# Patient Record
Sex: Female | Born: 1955 | Race: White | Hispanic: No | Marital: Married | Smoking: Never smoker
Health system: Southern US, Community
[De-identification: ages and names within clinical notes are randomized; demographics above are authoritative.]

## PROBLEM LIST (undated history)

## (undated) DIAGNOSIS — M8580 Other specified disorders of bone density and structure, unspecified site: Secondary | ICD-10-CM

## (undated) DIAGNOSIS — M1990 Unspecified osteoarthritis, unspecified site: Secondary | ICD-10-CM

## (undated) DIAGNOSIS — J349 Unspecified disorder of nose and nasal sinuses: Secondary | ICD-10-CM

## (undated) DIAGNOSIS — E213 Hyperparathyroidism, unspecified: Secondary | ICD-10-CM

## (undated) DIAGNOSIS — I519 Heart disease, unspecified: Secondary | ICD-10-CM

## (undated) DIAGNOSIS — K589 Irritable bowel syndrome without diarrhea: Secondary | ICD-10-CM

## (undated) DIAGNOSIS — E785 Hyperlipidemia, unspecified: Secondary | ICD-10-CM

## (undated) DIAGNOSIS — C439 Malignant melanoma of skin, unspecified: Secondary | ICD-10-CM

## (undated) DIAGNOSIS — R51 Headache: Secondary | ICD-10-CM

## (undated) DIAGNOSIS — Z973 Presence of spectacles and contact lenses: Secondary | ICD-10-CM

## (undated) HISTORY — DX: Malignant melanoma of skin, unspecified: C43.9

## (undated) HISTORY — DX: Unspecified disorder of nose and nasal sinuses: J34.9

## (undated) HISTORY — DX: Unspecified osteoarthritis, unspecified site: M19.90

## (undated) HISTORY — DX: Heart disease, unspecified: I51.9

## (undated) HISTORY — DX: Presence of spectacles and contact lenses: Z97.3

## (undated) HISTORY — DX: Hyperlipidemia, unspecified: E78.5

## (undated) HISTORY — PX: APPENDECTOMY: SHX54

## (undated) HISTORY — PX: ABDOMINAL HYSTERECTOMY: SHX81

## (undated) HISTORY — PX: OOPHORECTOMY: SHX86

## (undated) HISTORY — DX: Headache: R51

## (undated) HISTORY — DX: Irritable bowel syndrome, unspecified: K58.9

---

## 1983-05-10 HISTORY — PX: ABDOMINAL HYSTERECTOMY: SHX81

## 1989-05-09 HISTORY — PX: ASD REPAIR: SHX258

## 2002-05-09 DIAGNOSIS — C439 Malignant melanoma of skin, unspecified: Secondary | ICD-10-CM

## 2002-05-09 HISTORY — DX: Malignant melanoma of skin, unspecified: C43.9

## 2006-09-04 ENCOUNTER — Ambulatory Visit: Payer: Self-pay | Admitting: Unknown Physician Specialty

## 2007-04-24 ENCOUNTER — Encounter: Admission: RE | Admit: 2007-04-24 | Discharge: 2007-04-24 | Payer: Self-pay | Admitting: Surgery

## 2007-05-22 ENCOUNTER — Encounter: Admission: RE | Admit: 2007-05-22 | Discharge: 2007-05-22 | Payer: Self-pay | Admitting: Surgery

## 2008-09-04 ENCOUNTER — Ambulatory Visit: Payer: Self-pay | Admitting: Family Medicine

## 2010-01-18 ENCOUNTER — Ambulatory Visit: Payer: Self-pay | Admitting: Gastroenterology

## 2010-02-22 ENCOUNTER — Ambulatory Visit: Payer: Self-pay | Admitting: Gastroenterology

## 2010-02-25 LAB — PATHOLOGY REPORT

## 2010-05-30 ENCOUNTER — Encounter: Payer: Self-pay | Admitting: Surgery

## 2010-09-07 ENCOUNTER — Encounter (INDEPENDENT_AMBULATORY_CARE_PROVIDER_SITE_OTHER): Payer: Self-pay | Admitting: Surgery

## 2011-02-22 ENCOUNTER — Ambulatory Visit: Payer: Self-pay | Admitting: Obstetrics and Gynecology

## 2011-05-13 ENCOUNTER — Ambulatory Visit: Payer: Self-pay | Admitting: Obstetrics and Gynecology

## 2011-05-25 ENCOUNTER — Telehealth (INDEPENDENT_AMBULATORY_CARE_PROVIDER_SITE_OTHER): Payer: Self-pay

## 2011-05-25 ENCOUNTER — Other Ambulatory Visit (INDEPENDENT_AMBULATORY_CARE_PROVIDER_SITE_OTHER): Payer: Self-pay | Admitting: Surgery

## 2011-06-01 ENCOUNTER — Ambulatory Visit (INDEPENDENT_AMBULATORY_CARE_PROVIDER_SITE_OTHER): Payer: Self-pay | Admitting: Surgery

## 2011-06-23 ENCOUNTER — Ambulatory Visit: Payer: Self-pay | Admitting: Gastroenterology

## 2011-06-23 NOTE — Telephone Encounter (Signed)
error 

## 2011-08-25 ENCOUNTER — Ambulatory Visit: Payer: Self-pay | Admitting: Obstetrics and Gynecology

## 2011-09-02 ENCOUNTER — Ambulatory Visit: Payer: Self-pay | Admitting: Obstetrics and Gynecology

## 2011-09-02 HISTORY — PX: OOPHORECTOMY: SHX86

## 2011-09-06 LAB — PATHOLOGY REPORT

## 2014-08-29 ENCOUNTER — Ambulatory Visit
Admit: 2014-08-29 | Disposition: A | Discharge status: Home / Self Care | Payer: Self-pay | Attending: Internal Medicine | Admitting: Internal Medicine

## 2014-08-31 NOTE — Op Note (Signed)
PATIENT NAME:  Sheri Curtis, Sheri Curtis MR#:  [id number] DATE OF BIRTH:  05-Sep-1955  DATE OF PROCEDURE:  09/02/2011  PREOPERATIVE DIAGNOSIS: Symptomatic right ovarian cyst with chronic pelvic pain.   POSTOPERATIVE DIAGNOSES:  1. Chronic pelvic pain, right. 2. Right ovarian cyst.  3. Right hydrosalpinx. 4. Extensive pelvic adhesions.  PROCEDURES PERFORMED: 1. Extensive pelvic adhesiolysis encompassing greater than 75% of total operating time.  2. Right salpingo-oophorectomy.   SURGEON: Boykin Nearing, MD   FIRST ASSISTANT: Franchot Erichsen, MD  ANESTHESIA: General endotracheal anesthesia.   INDICATIONS: This is a 59 year old gravida 2, para 2 a patient with intermittent right lower quadrant, right pelvic pain for greater than one year. The patient has had a persistent ovarian cyst on the right measuring 37 x 30 x 12 mm. The patient is status post total abdominal hysterectomy.   DESCRIPTION OF PROCEDURE: After adequate general endotracheal anesthesia, the patient was placed in the dorsal supine position with legs in the [place] stirrups. The patient's abdomen was prepped and draped in normal sterile fashion including perineum and vagina. Straight catheterization of the bladder yielded 50 mL of urine. A 15 mm infraumbilical incision was made, after injecting with 0.5% Marcaine. The laparoscope was advanced into the abdominal cavity under direct visualization with the Optiview cannula. Once inside the abdominal cavity, the patient's abdomen was insufflated with carbon dioxide. Two separate additional ports were placed, one in the left lower quadrant 3 cm medial to the anterior iliac spine, and under direct vision visualization, a 10 mm port was advanced into the abdominal cavity. The 5 mm port was advanced on the right lower quadrant, again 3 cm medial to the anterior iliac spine, and the 5 mm port was advanced under direct visualization. The patient was placed in Trendelenburg. On initial impression,  there were extensive pelvic adhesions from the left sidewall all the way to the right sidewall incorporating bowel to bowel and bowel to cuff and bowel to sidewall. Extensive adhesiolysis then ensued with the Harmonic scalpel. Total adhesiolysis was greater than 75% of total operating time. Attention was directed starting on the left side where the sigmoid adhesions were removed from the sidewall. Small bowel adhesions to the sigmoid were removed as well as small bowel adhesions to the vaginal cuff were taken down meticulously. Intraoperative pictures were taken. Ultimately the right sidewall was freed up so the dilated fallopian tube consistent with a hydrosalpinx was identified. There was a fluid-filled cyst juxtaposed to the ovary, ovarian tissue all encompassed and scarred together. The infundibulopelvic ligament was identified and cauterized and then was transected with the Harmonic scalpel. The right ovarian tubal complex was placed on tension medially while dissection ensued to the lateral aspect. The sidewall was not entered and the retroperitoneum was not entered. Once the total mass was free, the ovarian cyst was inadvertently ruptured and straw-colored fluid drained from this cyst. The total mass then was placed into an Endopouch and was removed through the left port site; good hemostasis was noted on the left. Continued adhesiolysis was performed opening up the posterior cul-de-sac. Given the dense adhesions on the patient's left side, the left ovary and tube were never identified. At this point, the surgeons felt that it would be too difficult to try to continue adhesiolysis to get to the left tube and ovary; therefore, the procedure was aborted. The risk to bowel injury was greater than the benefit of removing the left tube and ovary given the patient was not having any left-sided symptoms. The  patient's abdomen was copiously irrigated. The intra-abdominal pressure was taken down, good hemostasis  noted. All port sites were removed. The infraumbilical and the left lower port site were closed in a deep layer with 2-0 Vicryl and all skin port sites were closed with interrupted 4-0 Vicryl suture. Steri-Strips were applied, sterile dressing applied. The sponge stick was removed and the Foley catheter which was placed intraoperatively was removed. The Foley was placed midway through the procedure since there appeared to be some bladder filling and distention while adhesiolysis was taking place. Urine was clear when the Foley was removed. Estimated blood loss minimal. Intraoperative fluids 600 mL. Urine output 125 mL. The patient tolerated the procedure well and was taken to the recovery room in good condition. ____________________________ Boykin Nearing, MD tjs:slb D: 09/02/2011 09:21:47 ET T: 09/02/2011 11:53:58 ET JOB#: [id number]  cc: Boykin Nearing, MD, <Dictator> Boykin Nearing MD ELECTRONICALLY SIGNED 09/06/2011 9:47

## 2014-09-02 ENCOUNTER — Other Ambulatory Visit: Payer: Self-pay | Admitting: Internal Medicine

## 2014-09-02 DIAGNOSIS — Z1231 Encounter for screening mammogram for malignant neoplasm of breast: Secondary | ICD-10-CM

## 2014-09-09 ENCOUNTER — Ambulatory Visit
Admission: RE | Admit: 2014-09-09 | Discharge: 2014-09-09 | Disposition: A | Discharge status: Home / Self Care | Payer: 59 | Source: Ambulatory Visit | Attending: Internal Medicine | Admitting: Internal Medicine

## 2014-09-09 DIAGNOSIS — Z1231 Encounter for screening mammogram for malignant neoplasm of breast: Secondary | ICD-10-CM

## 2017-07-17 ENCOUNTER — Other Ambulatory Visit: Payer: Self-pay | Admitting: Internal Medicine

## 2017-07-17 DIAGNOSIS — Z1231 Encounter for screening mammogram for malignant neoplasm of breast: Secondary | ICD-10-CM

## 2017-07-31 ENCOUNTER — Ambulatory Visit
Admission: RE | Admit: 2017-07-31 | Discharge: 2017-07-31 | Disposition: A | Discharge status: Home / Self Care | Payer: BC Managed Care – PPO | Source: Ambulatory Visit | Attending: Internal Medicine | Admitting: Internal Medicine

## 2017-07-31 ENCOUNTER — Encounter: Payer: Self-pay | Admitting: Radiology

## 2017-07-31 DIAGNOSIS — Z1231 Encounter for screening mammogram for malignant neoplasm of breast: Secondary | ICD-10-CM | POA: Diagnosis present

## 2018-04-02 HISTORY — PX: COLONOSCOPY WITH ESOPHAGOGASTRODUODENOSCOPY (EGD): SHX5779

## 2018-06-05 ENCOUNTER — Other Ambulatory Visit: Payer: Self-pay | Admitting: Gastroenterology

## 2018-06-05 ENCOUNTER — Other Ambulatory Visit (HOSPITAL_COMMUNITY): Payer: Self-pay | Admitting: Gastroenterology

## 2018-06-05 DIAGNOSIS — R1013 Epigastric pain: Secondary | ICD-10-CM

## 2018-06-05 DIAGNOSIS — R112 Nausea with vomiting, unspecified: Secondary | ICD-10-CM

## 2019-04-09 HISTORY — PX: OTHER SURGICAL HISTORY: SHX169

## 2019-08-02 ENCOUNTER — Ambulatory Visit: Payer: BC Managed Care – PPO | Attending: Internal Medicine

## 2019-08-02 DIAGNOSIS — Z23 Encounter for immunization: Secondary | ICD-10-CM

## 2019-08-02 NOTE — Progress Notes (Signed)
   Covid-19 Vaccination Clinic  Name:  Sheri Curtis    MRN: [id number] DOB: 1955/09/27  08/02/2019  [name] was observed post Covid-19 immunization for 15 minutes without incident. She was provided with Vaccine Information Sheet and instruction to access the V-Safe system.   [name] was instructed to call 911 with any severe reactions post vaccine: Marland Kitchen Difficulty breathing  . Swelling of face and throat  . A fast heartbeat  . A bad rash all over body  . Dizziness and weakness   Immunizations Administered    Name Date Dose VIS Date Route   Pfizer COVID-19 Vaccine 08/02/2019 10:27 AM 0.3 mL 04/19/2019 Intramuscular   Manufacturer: [place]   Lot: [id number]   [place]: [id number]

## 2019-08-27 ENCOUNTER — Ambulatory Visit: Payer: BC Managed Care – PPO | Attending: Internal Medicine

## 2019-08-27 DIAGNOSIS — Z23 Encounter for immunization: Secondary | ICD-10-CM

## 2019-08-27 NOTE — Progress Notes (Signed)
   Covid-19 Vaccination Clinic  Name:  CARLIN MADERO    MRN: [id number] DOB: [birth year]  08/27/2019  Ms. Groven was observed post Covid-19 immunization for 15 minutes without incident. She was provided with Vaccine Information Sheet and instruction to access the V-Safe system.   Ms. Forbus was instructed to call 911 with any severe reactions post vaccine: Marland Kitchen Difficulty breathing  . Swelling of face and throat  . A fast heartbeat  . A bad rash all over body  . Dizziness and weakness   Immunizations Administered    Name Date Dose VIS Date Route   Pfizer COVID-19 Vaccine 08/27/2019  1:57 PM 0.3 mL 07/03/2018 Intramuscular   Manufacturer: [place]   Lot: [id number]   [place]: [id number]

## 2020-03-09 ENCOUNTER — Other Ambulatory Visit: Payer: Self-pay | Admitting: Internal Medicine

## 2020-03-09 DIAGNOSIS — Z1231 Encounter for screening mammogram for malignant neoplasm of breast: Secondary | ICD-10-CM

## 2020-04-21 ENCOUNTER — Ambulatory Visit
Admission: RE | Admit: 2020-04-21 | Discharge: 2020-04-21 | Disposition: A | Discharge status: Home / Self Care | Payer: BC Managed Care – PPO | Source: Ambulatory Visit | Attending: Internal Medicine | Admitting: Internal Medicine

## 2020-04-21 ENCOUNTER — Other Ambulatory Visit: Payer: Self-pay

## 2020-04-21 DIAGNOSIS — Z1231 Encounter for screening mammogram for malignant neoplasm of breast: Secondary | ICD-10-CM | POA: Diagnosis present

## 2021-01-04 ENCOUNTER — Other Ambulatory Visit: Payer: Self-pay | Admitting: Student

## 2021-01-04 DIAGNOSIS — S83241A Other tear of medial meniscus, current injury, right knee, initial encounter: Secondary | ICD-10-CM

## 2021-01-28 ENCOUNTER — Ambulatory Visit
Admission: RE | Admit: 2021-01-28 | Discharge: 2021-01-28 | Disposition: A | Discharge status: Home / Self Care | Payer: Medicare PPO | Source: Ambulatory Visit | Attending: Student | Admitting: Student

## 2021-01-28 ENCOUNTER — Ambulatory Visit: Admission: RE | Admit: 2021-01-28 | Payer: Medicare PPO | Source: Ambulatory Visit

## 2021-01-28 ENCOUNTER — Other Ambulatory Visit: Payer: Self-pay

## 2021-01-28 DIAGNOSIS — S83241A Other tear of medial meniscus, current injury, right knee, initial encounter: Secondary | ICD-10-CM | POA: Diagnosis present

## 2021-01-28 DIAGNOSIS — X58XXXA Exposure to other specified factors, initial encounter: Secondary | ICD-10-CM | POA: Diagnosis not present

## 2021-01-28 DIAGNOSIS — M1711 Unilateral primary osteoarthritis, right knee: Secondary | ICD-10-CM | POA: Diagnosis not present

## 2021-01-28 HISTORY — PX: IR FLUORO GUIDED NEEDLE PLC ASPIRATION/INJECTION LOC: IMG2395

## 2021-01-28 MED ORDER — SODIUM CHLORIDE (PF) 0.9 % IJ SOLN
INTRAMUSCULAR | Status: AC
  Administered 2021-01-28: 10 mL
  Filled 2021-01-28: qty 10

## 2021-01-28 MED ORDER — LIDOCAINE HCL 1 % IJ SOLN
INTRAMUSCULAR | Status: AC
  Administered 2021-01-28: 10 mL
  Filled 2021-01-28: qty 20

## 2021-01-28 MED ORDER — IOHEXOL 180 MG/ML  SOLN
30.0000 mL | Freq: Once | INTRAMUSCULAR | Status: AC | PRN
  Administered 2021-01-28: 30 mL via INTRA_ARTICULAR
  Filled 2021-01-28: qty 30

## 2021-01-28 NOTE — Procedures (Signed)
Successful fluoroscopic guided right knee CT arthrogram. No complications. EBL N/A See PACS for full report.  Hedy Jacob, PA-C 01/28/2021, 10:31 AM

## 2021-03-16 ENCOUNTER — Other Ambulatory Visit: Payer: Self-pay | Admitting: Internal Medicine

## 2021-03-16 DIAGNOSIS — Z1231 Encounter for screening mammogram for malignant neoplasm of breast: Secondary | ICD-10-CM

## 2021-04-28 ENCOUNTER — Other Ambulatory Visit: Payer: Self-pay

## 2021-04-28 ENCOUNTER — Ambulatory Visit
Admission: RE | Admit: 2021-04-28 | Discharge: 2021-04-28 | Disposition: A | Discharge status: Home / Self Care | Payer: Medicare PPO | Source: Ambulatory Visit | Attending: Internal Medicine | Admitting: Internal Medicine

## 2021-04-28 DIAGNOSIS — Z1231 Encounter for screening mammogram for malignant neoplasm of breast: Secondary | ICD-10-CM | POA: Diagnosis not present

## 2021-05-04 ENCOUNTER — Other Ambulatory Visit: Payer: Self-pay | Admitting: Surgery

## 2021-05-12 ENCOUNTER — Other Ambulatory Visit
Admission: RE | Admit: 2021-05-12 | Discharge: 2021-05-12 | Disposition: A | Discharge status: Home / Self Care | Payer: Medicare PPO | Source: Ambulatory Visit | Attending: Surgery | Admitting: Surgery

## 2021-05-12 ENCOUNTER — Other Ambulatory Visit: Payer: Self-pay

## 2021-05-12 NOTE — Patient Instructions (Signed)
Your procedure is scheduled on: Tuesday May 18, 2021 Report to Day Surgery. To find out your arrival time please call [phone] between 1PM - 3PM on Monday May 17, 2021.  Remember: Instructions that are not followed completely may result in serious medical risk,  up to and including death, or upon the discretion of your surgeon and anesthesiologist your  surgery may need to be rescheduled.     _X__ 1. Do not eat food after midnight the night before your procedure.                 No chewing gum or hard candies. You may drink clear liquids up to 2 hours                 before you are scheduled to arrive for your surgery- DO not drink clear                 liquids within 2 hours of the start of your surgery.                 Clear Liquids include:  water, apple juice without pulp, clear Gatorade, G2 or                  Gatorade Zero (avoid Red/Purple/Blue), Black Coffee or Tea (Do not add                 anything to coffee or tea). __X__2.   Complete the "Ensure Clear Pre-surgery Clear Carbohydrate Drink" provided to you, 2 hours before arrival. **If you are diabetic you will be provided with an alternative drink, Gatorade Zero or G2.  __X__3.  On the morning of surgery brush your teeth with toothpaste and water, you                may rinse your mouth with mouthwash if you wish.  Do not swallow any toothpaste of mouthwash.     _X__ 4.  No Alcohol for 24 hours before or after surgery.   _X__ 5.  Do Not Smoke or use e-cigarettes For 24 Hours Prior to Your Surgery.                 Do not use any chewable tobacco products for at least 6 hours prior to                 Surgery.  _X__  6.  Do not use any recreational drugs (marijuana, cocaine, heroin, ecstasy, MDMA or other)                For at least one week prior to your surgery.  Combination of these drugs with anesthesia                May have life threatening results.  __X__  7.  Notify your doctor if  there is any change in your medical condition      (cold, fever, infections).     Do not wear jewelry, make-up, hairpins, clips or nail polish. Do not wear lotions, powders, or perfumes. You may wear deodorant. Do not shave 48 hours prior to surgery. Men may shave face and neck. Do not bring valuables to the hospital.    [facility] is not responsible for any belongings or valuables.  Contacts, dentures or bridgework may not be worn into surgery. Leave your suitcase in the car. After surgery it may be brought to your room. For patients admitted to the hospital, discharge  time is determined by your treatment team.   Patients discharged the day of surgery will not be allowed to drive home.   Make arrangements for someone to be with you for the first 24 hours of your Same Day Discharge.   _X___ Take these medicines the morning of surgery with A SIP OF WATER:    1. colestipol (COLESTID) 1 g   2. dicyclomine (BENTYL) 10 MG   3.   4.  5.  6.  ____ Fleet Enema (as directed)   __X__ Use CHG Soap (or wipes) as directed  ____ Use Benzoyl Peroxide Gel as instructed  ____ Use inhalers on the day of surgery  ____ Stop metformin 2 days prior to surgery    ____ Take 1/2 of usual insulin dose the night before surgery. No insulin the morning          of surgery.   ____ Call your PCP, cardiologist, or Pulmonologist if taking Coumadin/Plavix/aspirin and ask when to stop before your surgery.   __X__ One Week prior to surgery- Stop Anti-inflammatories such as Ibuprofen, Aleve, Advil, Motrin, meloxicam (MOBIC), diclofenac, etodolac, ketorolac, Toradol, Daypro, piroxicam, Goody's or BC powders. OK TO USE TYLENOL IF NEEDED   __X__ One week prior to surgery sop ALL supplements until after surgery.    ____ Bring C-Pap to the hospital.    If you have any questions regarding your pre-procedure instructions,  Please call Pre-admit Testing at [phone]

## 2021-05-18 ENCOUNTER — Ambulatory Visit: Admit: 2021-05-18 | Payer: Medicare PPO | Admitting: Surgery

## 2021-05-18 SURGERY — ARTHROSCOPY, KNEE
Anesthesia: Choice | Site: Knee | Laterality: Right

## 2021-06-02 ENCOUNTER — Encounter
Admission: RE | Admit: 2021-06-02 | Discharge: 2021-06-02 | Disposition: A | Discharge status: Home / Self Care | Payer: Medicare PPO | Source: Ambulatory Visit | Attending: Surgery | Admitting: Surgery

## 2021-06-02 ENCOUNTER — Other Ambulatory Visit: Payer: Self-pay | Admitting: Surgery

## 2021-06-02 ENCOUNTER — Encounter: Payer: Self-pay | Admitting: Urgent Care

## 2021-06-02 ENCOUNTER — Other Ambulatory Visit: Payer: Self-pay

## 2021-06-02 DIAGNOSIS — Z01818 Encounter for other preprocedural examination: Secondary | ICD-10-CM | POA: Diagnosis present

## 2021-06-02 DIAGNOSIS — I519 Heart disease, unspecified: Secondary | ICD-10-CM | POA: Diagnosis not present

## 2021-06-02 DIAGNOSIS — Z01810 Encounter for preprocedural cardiovascular examination: Secondary | ICD-10-CM | POA: Diagnosis not present

## 2021-06-02 DIAGNOSIS — Z01812 Encounter for preprocedural laboratory examination: Secondary | ICD-10-CM

## 2021-06-02 LAB — CBC
HCT: 43.6 % (ref 36.0–46.0)
Hemoglobin: 15 g/dL (ref 12.0–15.0)
MCH: 32.1 pg (ref 26.0–34.0)
MCHC: 34.4 g/dL (ref 30.0–36.0)
MCV: 93.2 fL (ref 80.0–100.0)
Platelets: 260 10*3/uL (ref 150–400)
RBC: 4.68 MIL/uL (ref 3.87–5.11)
RDW: 12 % (ref 11.5–15.5)
WBC: 5.6 10*3/uL (ref 4.0–10.5)
nRBC: 0 % (ref 0.0–0.2)

## 2021-06-02 LAB — BASIC METABOLIC PANEL
Anion gap: 7 (ref 5–15)
BUN: 17 mg/dL (ref 8–23)
CO2: 27 mmol/L (ref 22–32)
Calcium: 10.3 mg/dL (ref 8.9–10.3)
Chloride: 106 mmol/L (ref 98–111)
Creatinine, Ser: 0.67 mg/dL (ref 0.44–1.00)
GFR, Estimated: >60 mL/min (ref >60)
Glucose, Bld: 94 mg/dL (ref 70–99)
Potassium: 4.2 mmol/L (ref 3.5–5.1)
Sodium: 140 mmol/L (ref 135–145)

## 2021-06-03 ENCOUNTER — Ambulatory Visit
Admission: RE | Admit: 2021-06-03 | Discharge: 2021-06-03 | Disposition: A | Discharge status: Home / Self Care | Payer: Medicare PPO | Attending: Surgery | Admitting: Surgery

## 2021-06-03 ENCOUNTER — Ambulatory Visit: Payer: Medicare PPO | Admitting: Anesthesiology

## 2021-06-03 ENCOUNTER — Encounter
Admission: RE | Disposition: A | Discharge status: Home / Self Care | Payer: Self-pay | Source: Home / Self Care | Attending: Surgery

## 2021-06-03 ENCOUNTER — Other Ambulatory Visit: Payer: Self-pay

## 2021-06-03 ENCOUNTER — Encounter: Payer: Self-pay | Admitting: Surgery

## 2021-06-03 DIAGNOSIS — Y939 Activity, unspecified: Secondary | ICD-10-CM | POA: Insufficient documentation

## 2021-06-03 DIAGNOSIS — S83271A Complex tear of lateral meniscus, current injury, right knee, initial encounter: Secondary | ICD-10-CM | POA: Diagnosis not present

## 2021-06-03 DIAGNOSIS — M94261 Chondromalacia, right knee: Secondary | ICD-10-CM | POA: Insufficient documentation

## 2021-06-03 DIAGNOSIS — X58XXXA Exposure to other specified factors, initial encounter: Secondary | ICD-10-CM | POA: Diagnosis not present

## 2021-06-03 DIAGNOSIS — S83231A Complex tear of medial meniscus, current injury, right knee, initial encounter: Secondary | ICD-10-CM | POA: Diagnosis not present

## 2021-06-03 DIAGNOSIS — Z01812 Encounter for preprocedural laboratory examination: Secondary | ICD-10-CM

## 2021-06-03 DIAGNOSIS — M1711 Unilateral primary osteoarthritis, right knee: Secondary | ICD-10-CM | POA: Diagnosis present

## 2021-06-03 HISTORY — PX: KNEE ARTHROSCOPY: SHX127

## 2021-06-03 SURGERY — ARTHROSCOPY, KNEE
Anesthesia: General | Site: Knee | Laterality: Right

## 2021-06-03 MED ORDER — CEFAZOLIN SODIUM-DEXTROSE 2-4 GM/100ML-% IV SOLN
INTRAVENOUS | Status: AC
  Filled 2021-06-03: qty 100

## 2021-06-03 MED ORDER — MEPERIDINE HCL 25 MG/ML IJ SOLN
INTRAMUSCULAR | Status: AC
  Filled 2021-06-03: qty 1

## 2021-06-03 MED ORDER — PROPOFOL 10 MG/ML IV BOLUS
INTRAVENOUS | Status: DC | PRN
Start: 2021-06-03 — End: 2021-06-03
  Administered 2021-06-03: 50 mg via INTRAVENOUS
  Administered 2021-06-03: 150 mg via INTRAVENOUS

## 2021-06-03 MED ORDER — BUPIVACAINE-EPINEPHRINE (PF) 0.5% -1:200000 IJ SOLN
INTRAMUSCULAR | Status: AC
  Filled 2021-06-03: qty 30

## 2021-06-03 MED ORDER — PROPOFOL 10 MG/ML IV BOLUS
INTRAVENOUS | Status: AC
  Filled 2021-06-03: qty 20

## 2021-06-03 MED ORDER — KETOROLAC TROMETHAMINE 15 MG/ML IJ SOLN: 15.0000 mg | Freq: Once | INTRAMUSCULAR | Status: DC

## 2021-06-03 MED ORDER — ONDANSETRON HCL 4 MG/2ML IJ SOLN
INTRAMUSCULAR | Status: AC
  Filled 2021-06-03: qty 2

## 2021-06-03 MED ORDER — HYDROCODONE-ACETAMINOPHEN 5-325 MG PO TABS: 1.0000 | ORAL_TABLET | Freq: Four times a day (QID) | ORAL | 0 refills | Status: DC | PRN

## 2021-06-03 MED ORDER — KETOROLAC TROMETHAMINE 30 MG/ML IJ SOLN
INTRAMUSCULAR | Status: DC | PRN
  Administered 2021-06-03: 30 mg via INTRAVENOUS

## 2021-06-03 MED ORDER — MEPERIDINE HCL 25 MG/ML IJ SOLN
6.2500 mg | INTRAMUSCULAR | Status: AC
  Administered 2021-06-03: 6.25 mg via INTRAVENOUS

## 2021-06-03 MED ORDER — LIDOCAINE HCL (PF) 2 % IJ SOLN
INTRAMUSCULAR | Status: AC
  Filled 2021-06-03: qty 5

## 2021-06-03 MED ORDER — BUPIVACAINE-EPINEPHRINE (PF) 0.5% -1:200000 IJ SOLN
INTRAMUSCULAR | Status: DC | PRN
  Administered 2021-06-03: 20 mL

## 2021-06-03 MED ORDER — CHLORHEXIDINE GLUCONATE 0.12 % MT SOLN
OROMUCOSAL | Status: AC
  Administered 2021-06-03: 15 mL via OROMUCOSAL
  Filled 2021-06-03: qty 15

## 2021-06-03 MED ORDER — LACTATED RINGERS IV SOLN: INTRAVENOUS | Status: DC

## 2021-06-03 MED ORDER — MIDAZOLAM HCL 2 MG/2ML IJ SOLN
INTRAMUSCULAR | Status: DC | PRN
  Administered 2021-06-03 (×2): 1 mg via INTRAVENOUS

## 2021-06-03 MED ORDER — ORAL CARE MOUTH RINSE: 15.0000 mL | Freq: Once | OROMUCOSAL | Status: AC

## 2021-06-03 MED ORDER — ACETAMINOPHEN 10 MG/ML IV SOLN: 1000.0000 mg | Freq: Once | INTRAVENOUS | Status: DC | PRN

## 2021-06-03 MED ORDER — ACETAMINOPHEN 10 MG/ML IV SOLN
INTRAVENOUS | Status: AC
  Filled 2021-06-03: qty 100

## 2021-06-03 MED ORDER — GLYCOPYRROLATE 0.2 MG/ML IJ SOLN
INTRAMUSCULAR | Status: DC | PRN
  Administered 2021-06-03: .2 mg via INTRAVENOUS

## 2021-06-03 MED ORDER — OXYCODONE HCL 5 MG/5ML PO SOLN: 5.0000 mg | Freq: Once | ORAL | Status: DC | PRN

## 2021-06-03 MED ORDER — FAMOTIDINE 20 MG PO TABS
ORAL_TABLET | ORAL | Status: AC
  Administered 2021-06-03: 20 mg via ORAL
  Filled 2021-06-03: qty 1

## 2021-06-03 MED ORDER — DEXAMETHASONE SODIUM PHOSPHATE 10 MG/ML IJ SOLN
INTRAMUSCULAR | Status: AC
  Filled 2021-06-03: qty 1

## 2021-06-03 MED ORDER — DEXAMETHASONE SODIUM PHOSPHATE 10 MG/ML IJ SOLN
INTRAMUSCULAR | Status: DC | PRN
Start: 2021-06-03 — End: 2021-06-03
  Administered 2021-06-03: 5 mg via INTRAVENOUS

## 2021-06-03 MED ORDER — LIDOCAINE HCL (CARDIAC) PF 100 MG/5ML IV SOSY
PREFILLED_SYRINGE | INTRAVENOUS | Status: DC | PRN
  Administered 2021-06-03: 80 mg via INTRAVENOUS

## 2021-06-03 MED ORDER — ACETAMINOPHEN 10 MG/ML IV SOLN
INTRAVENOUS | Status: DC | PRN
  Administered 2021-06-03: 1000 mg via INTRAVENOUS

## 2021-06-03 MED ORDER — FENTANYL CITRATE (PF) 100 MCG/2ML IJ SOLN
INTRAMUSCULAR | Status: DC | PRN
  Administered 2021-06-03: 25 ug via INTRAVENOUS
  Administered 2021-06-03: 50 ug via INTRAVENOUS
  Administered 2021-06-03: 25 ug via INTRAVENOUS

## 2021-06-03 MED ORDER — LACTATED RINGERS IR SOLN
Status: DC | PRN
  Administered 2021-06-03: 3000 mL

## 2021-06-03 MED ORDER — LIDOCAINE HCL (PF) 1 % IJ SOLN
INTRAMUSCULAR | Status: DC | PRN
  Administered 2021-06-03: 60 mL

## 2021-06-03 MED ORDER — CHLORHEXIDINE GLUCONATE 0.12 % MT SOLN: 15.0000 mL | Freq: Once | OROMUCOSAL | Status: AC

## 2021-06-03 MED ORDER — CEFAZOLIN SODIUM-DEXTROSE 2-4 GM/100ML-% IV SOLN
2.0000 g | INTRAVENOUS | Status: AC
  Administered 2021-06-03: 2 g via INTRAVENOUS

## 2021-06-03 MED ORDER — EPHEDRINE SULFATE (PRESSORS) 50 MG/ML IJ SOLN
INTRAMUSCULAR | Status: DC | PRN
  Administered 2021-06-03: 10 mg via INTRAVENOUS

## 2021-06-03 MED ORDER — LIDOCAINE HCL (PF) 1 % IJ SOLN
INTRAMUSCULAR | Status: AC
  Filled 2021-06-03: qty 30

## 2021-06-03 MED ORDER — MIDAZOLAM HCL 2 MG/2ML IJ SOLN
INTRAMUSCULAR | Status: AC
  Filled 2021-06-03: qty 2

## 2021-06-03 MED ORDER — FAMOTIDINE 20 MG PO TABS: 20.0000 mg | ORAL_TABLET | Freq: Once | ORAL | Status: AC

## 2021-06-03 MED ORDER — FENTANYL CITRATE (PF) 100 MCG/2ML IJ SOLN
INTRAMUSCULAR | Status: AC
  Filled 2021-06-03: qty 2

## 2021-06-03 MED ORDER — ONDANSETRON HCL 4 MG/2ML IJ SOLN: 4.0000 mg | Freq: Once | INTRAMUSCULAR | Status: DC | PRN

## 2021-06-03 MED ORDER — FENTANYL CITRATE (PF) 100 MCG/2ML IJ SOLN: 25.0000 ug | INTRAMUSCULAR | Status: DC | PRN

## 2021-06-03 MED ORDER — PHENYLEPHRINE 40 MCG/ML (10ML) SYRINGE FOR IV PUSH (FOR BLOOD PRESSURE SUPPORT)
PREFILLED_SYRINGE | INTRAVENOUS | Status: DC | PRN
  Administered 2021-06-03: 80 ug via INTRAVENOUS

## 2021-06-03 MED ORDER — OXYCODONE HCL 5 MG PO TABS: 5.0000 mg | ORAL_TABLET | Freq: Once | ORAL | Status: DC | PRN

## 2021-06-03 MED ORDER — ONDANSETRON HCL 4 MG/2ML IJ SOLN
INTRAMUSCULAR | Status: DC | PRN
  Administered 2021-06-03: 4 mg via INTRAVENOUS

## 2021-06-03 SURGICAL SUPPLY — 43 items
APL PRP STRL LF DISP 70% ISPRP (MISCELLANEOUS) ×1
BAG COUNTER SPONGE SURGICOUNT (BAG) IMPLANT
BAG SPNG CNTER NS LX DISP (BAG)
BLADE FULL RADIUS 3.5 (BLADE) ×2 IMPLANT
BLADE SHAVER 4.5X7 STR FR (MISCELLANEOUS) ×1 IMPLANT
BNDG ELASTIC 6X5.8 VLCR STR LF (GAUZE/BANDAGES/DRESSINGS) ×2 IMPLANT
BNDG ESMARK 6X12 TAN STRL LF (GAUZE/BANDAGES/DRESSINGS) ×2 IMPLANT
CHLORAPREP W/TINT 26 (MISCELLANEOUS) ×2 IMPLANT
CUFF TOURN SGL QUICK 24 (TOURNIQUET CUFF)
CUFF TOURN SGL QUICK 34 (TOURNIQUET CUFF)
CUFF TRNQT CYL 24X4X16.5-23 (TOURNIQUET CUFF) IMPLANT
CUFF TRNQT CYL 34X4.125X (TOURNIQUET CUFF) IMPLANT
DRAPE ARTHRO LIMB 89X125 STRL (DRAPES) ×2 IMPLANT
DRAPE IMP U-DRAPE 54X76 (DRAPES) ×2 IMPLANT
ELECT REM PT RETURN 9FT ADLT (ELECTROSURGICAL) ×2
ELECTRODE REM PT RTRN 9FT ADLT (ELECTROSURGICAL) ×1 IMPLANT
FASTFIX NDL DEL SYS 360 CVD (Miscellaneous) ×2 IMPLANT
GAUZE SPONGE 4X4 12PLY STRL (GAUZE/BANDAGES/DRESSINGS) ×2 IMPLANT
GLOVE SURG ENC MOIS LTX SZ8 (GLOVE) ×4 IMPLANT
GLOVE SURG ENC TEXT LTX SZ7 (GLOVE) ×4 IMPLANT
GLOVE SURG UNDER LTX SZ8 (GLOVE) ×2 IMPLANT
GLOVE SURG UNDER POLY LF SZ7.5 (GLOVE) ×2 IMPLANT
GOWN STRL REUS W/ TWL LRG LVL3 (GOWN DISPOSABLE) ×1 IMPLANT
GOWN STRL REUS W/ TWL XL LVL3 (GOWN DISPOSABLE) ×2 IMPLANT
GOWN STRL REUS W/TWL LRG LVL3 (GOWN DISPOSABLE) ×2
GOWN STRL REUS W/TWL XL LVL3 (GOWN DISPOSABLE) ×4
IV LACTATED RINGER IRRG 3000ML (IV SOLUTION) ×2
IV LR IRRIG 3000ML ARTHROMATIC (IV SOLUTION) ×1 IMPLANT
KIT TURNOVER KIT A (KITS) ×2 IMPLANT
MANIFOLD NEPTUNE II (INSTRUMENTS) ×4 IMPLANT
NDL HYPO 21X1.5 SAFETY (NEEDLE) ×1 IMPLANT
NEEDLE HYPO 21X1.5 SAFETY (NEEDLE) ×2 IMPLANT
PACK ARTHROSCOPY KNEE (MISCELLANEOUS) ×2 IMPLANT
PENCIL ELECTRO HAND CTR (MISCELLANEOUS) ×2 IMPLANT
PUSHER KNOT ARTHRO STRT FASTFI (MISCELLANEOUS) ×1 IMPLANT
SPONGE T-LAP 18X18 ~~LOC~~+RFID (SPONGE) ×2 IMPLANT
SUT PROLENE 4 0 PS 2 18 (SUTURE) ×2 IMPLANT
SUT TICRON COATED BLUE 2 0 30 (SUTURE) IMPLANT
SYR 50ML LL SCALE MARK (SYRINGE) ×2 IMPLANT
SYSTEM NDL DEL FSTFX  360 CVD (Miscellaneous) IMPLANT
TUBING INFLOW SET DBFLO PUMP (TUBING) ×2 IMPLANT
WAND WEREWOLF FLOW 90D (MISCELLANEOUS) ×2 IMPLANT
WATER STERILE IRR 500ML POUR (IV SOLUTION) ×2 IMPLANT

## 2021-06-03 NOTE — Anesthesia Postprocedure Evaluation (Signed)
Anesthesia Post Note  Patient: MATRICE HERRO  Procedure(s) Performed: RIGHT KNEE ARTHROSCOPY WITH DEBRIDEMENT AND PARTAIL MEDIAL AND PARTIAL LATERAL MENISCECTOMIES, MEDIAL MENISCUS REPAIR, ABRASION CHONDROPLASTY OF MEDIAL FEMORAL CONDYLE (Right: Knee)  Patient location during evaluation: PACU Anesthesia Type: General Level of consciousness: awake and alert Pain management: pain level controlled Vital Signs Assessment: post-procedure vital signs reviewed and stable Respiratory status: spontaneous breathing, nonlabored ventilation, respiratory function stable and patient connected to nasal cannula oxygen Cardiovascular status: blood pressure returned to baseline and stable Postop Assessment: no apparent nausea or vomiting Anesthetic complications: no   No notable events documented.   Last Vitals:  Vitals:   06/03/21 1600 06/03/21 1647  BP: (!) 145/90 138/76  Pulse: 73 80  Resp: 12 18  Temp: (!) 36.3 C 36.7 C  SpO2: 100% 100%    Last Pain:  Vitals:   06/03/21 1647  TempSrc:   PainSc: 0-No pain                 Precious Haws Shaleena Crusoe

## 2021-06-03 NOTE — H&P (Signed)
History of Present Illness: Sheri Curtis is a 66 y.o. female who presents today for her surgical history and physical for upcoming right knee arthroscopy with debridement and partial medial meniscectomy. Surgery scheduled Dr. Roland Rack on 06/03/2021. The patient denies any changes in her medical history since she was last evaluated. The patient denies any personal history of heart attack, stroke, asthma or COPD. No personal history of blood clots. The patient reports a 4 out of 10 pain score in the right knee at today's visit. She denies any numbness or tingling to bilateral lower extremities at this time. The patient does take Bentyl and Colestid for abdominal symptoms and discomfort, she was instructed that she can take these medications up until surgery.  Past Medical History:  Colon polyp   Hyperparathyroidism (CMS-HCC) with chronic hypercalcemia in the 10.4 - 10.6 mg/dl range. 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   colestipoL (COLESTID) 1 gram tablet Take 1 tablet (1 g total) by mouth 2 (two) times daily Take before meals 180 tablet 3   dicyclomine (BENTYL) 10 mg capsule Take 1 capsule (10 mg total) by mouth 4 (four) times daily before meals and nightly 120 capsule 11   dicyclomine (BENTYL) 10 mg capsule Take by mouth Take 20 mg by mouth in  the morning and at bedtime.   fexofenadine (ALLEGRA) 180 MG tablet Take 180 mg by mouth once daily.   glucosamine-chondroitin 500-400 mg capsule Take 1 tablet by mouth once daily   Allergies:  Sulfa (Sulfonamide Antibiotics) Hives   Sulfur (Not Sulfa) Unknown and Hives   Review of Systems:  A comprehensive 14 point ROS was performed, reviewed by me today, and the pertinent orthopaedic findings are documented in the HPI.  Physical Exam: BP 138/78   Ht 157.5 cm (5\' 2" )   Wt 61.9 kg (136 lb 6.4 oz)   BMI 24.95 kg/m  General/Constitutional: The patient appears to be well-nourished, well-developed, and in no acute distress. Neuro/Psych: Normal mood and affect, oriented to person, place and time. Eyes: Non-icteric. Pupils are equal, round, and reactive to light, and exhibit synchronous movement. ENT: Unremarkable. Lymphatic: No palpable adenopathy. Respiratory: Lungs clear to auscultation, Normal chest excursion, No wheezes and Non-labored breathing Cardiovascular: Regular rate and rhythm. No murmurs. and No edema, swelling or tenderness, except as noted in detailed exam. Integumentary: No impressive skin lesions present, except as noted in detailed exam. Musculoskeletal: Unremarkable, except as noted in detailed exam.  Right knee exam: GAIT: Essentially normal gait and uses no assistive devices. ALIGNMENT: normal SKIN: unremarkable SWELLING: minimal EFFUSION: small WARMTH: no warmth TENDERNESS: mild over the medial joint line ROM: 0 to 135 degrees with pain in maximal flexion McMURRAY'S: positive PATELLOFEMORAL: Normal tracking and mild medial peripatellar pain CREPITUS: Mild patellofemoral crepitance LACHMAN'S: negative PIVOT SHIFT: negative ANTERIOR DRAWER: negative POSTERIOR DRAWER: negative VARUS/VALGUS: stable  She is neurovascularly intact to the right lower extremity and foot.  Imaging: CT ARTHROGRAPHY OF THE RIGHT KNEE:  1. Partial radial tear of the posterior horn  the medial meniscus.  2. Tricompartmental cartilage abnormalities as described above.   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 Treatment options were discussed today with the patient. 2. The patient scheduled for a right knee arthroscopy with debridement and partial medial meniscectomy with Dr. Roland Rack on 06/03/2021. 3. The risk and benefits of surgery were discussed in detail at today's visit. The patient would like to proceed with surgery at this time. 4. This document will serve as a surgical history and physical for the patient. 5. The patient will follow-up per standard postop protocol. They can call the clinic they have any questions, new symptoms develop or symptoms worsen.   H&P reviewed and patient re-examined. No changes.

## 2021-06-03 NOTE — Op Note (Signed)
06/03/2021  3:33 PM  Patient:   Sheri Curtis  Pre-Op Diagnosis:   Medial and lateral meniscus tears with underlying degenerative joint disease, right knee.  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  Surgeon:   Pascal Lux, MD  Assistant:   Rocco Pauls, PA-S  Anesthesia:   General LMA  Findings:   As above.  There was a complex tear involving the posterior portion of the medial meniscus with a small bucket-handle component. There was a complex tear of the posterior horn of the lateral meniscus with an unstable flap component. There were grade 3 chondromalacial changes involving the lateral portion of the medial femoral condyle and grade 3-4 chondromalacial changes involving the posterior portion of the lateral tibial plateau. There also were grade 1 chondromalacial changes involving the femoral trochlea and central ridge of the patella.  The anterior and posterior cruciate ligaments both were in satisfactory condition.  Complications:   None  EBL:   5 cc.  Total fluids:   700 cc of crystalloid.  Tourniquet time:   None  Drains:   None  Closure:   4-0 Prolene interrupted sutures.  Brief clinical note:   The patient is a 66 year old female with a history of progressively worsening medial sided right knee pain. Her symptoms have progressed despite medications, activity modification, etc. The patient presents at this time for arthroscopy, debridement, and repair versus partial medial and/or lateral meniscectomies.  Procedure:   The patient was brought into the operating room and lain in the supine position. After adequate general laryngeal mask anesthesia was obtained, a timeout was performed to verify the appropriate side. The patient's right knee was injected sterilely  using a solution of 30 cc of 1% lidocaine and 30 cc of 0.5% Sensorcaine with epinephrine. The right lower extremity was prepped with ChloraPrep solution before being draped sterilely. Preoperative antibiotics were administered. The expected portal sites were injected with 0.5% Sensorcaine with epinephrine before the camera was placed in the anterolateral portal and instrumentation performed through the anteromedial portal.   The knee was sequentially examined beginning in the suprapatellar pouch, then progressing to the patellofemoral space, the medial gutter and compartment, the notch, and finally the lateral compartment and gutter. The findings were as described above. Abundant reactive synovial tissues anteriorly were debrided using the full-radius resector in order to improve visualization. The medial meniscus was carefully probed, confirming the longitudinal tear involving the posterior medial portion of the meniscus. There also were extensive degenerative fraying of the more central portion. These areas were debrided back to stable margins using the full-radius resector before the longitudinally torn portion of the meniscus was stabilized using a single [employer] FasT-Fix 360 anchor. Subsequent probing of the repair demonstrated excellent stability.   Laterally, the torn portion of the posterior aspect of the lateral meniscus was debrided back to stable margins using combination of a full-radius resector and baskets. Again, probing of the remaining rim demonstrated excellent stability. The areas of grade III chondromalacia involving the lateral portion of the medial weightbearing surface of the medial femoral condyle were debrided back to stable margins using the full-radius resector. The instruments were removed from the joint after suctioning the excess fluid.   The portal sites were closed using 4-0 Prolene interrupted sutures before a sterile bulky dressing was applied to  the knee. The patient  was then awakened, extubated, and returned to the recovery room in satisfactory condition after tolerating the procedure well.

## 2021-06-03 NOTE — Anesthesia Procedure Notes (Signed)
Procedure Name: LMA Insertion Date/Time: 06/03/2021 2:25 PM Performed by: Loletha Grayer, CRNA Pre-anesthesia Checklist: Patient identified, Patient being monitored, Timeout performed, Emergency Drugs available and Suction available Patient Re-evaluated:Patient Re-evaluated prior to induction Oxygen Delivery Method: Circle system utilized Preoxygenation: Pre-oxygenation with 100% oxygen Induction Type: IV induction Ventilation: Mask ventilation without difficulty LMA: LMA inserted LMA Size: 3.0 Number of attempts: 2 Placement Confirmation: positive ETCO2 and breath sounds checked- equal and bilateral Tube secured with: Tape Dental Injury: Teeth and Oropharynx as per pre-operative assessment  Comments: LMA #4 x1 attempt. Aborted-due to small mouth opening. Atraumatic. #3LMA  x1 attempt with ease. Also, atraumatic.

## 2021-06-03 NOTE — Discharge Instructions (Addendum)
Orthopedic discharge instructions: Keep dressing dry and intact.  May shower after dressing changed on post-op day #4 (Monday).  Cover sutures with Band-Aids after drying off. Apply ice frequently to knee. Take ibuprofen 600 mg TID with meals for 3-5 days, then as necessary. Take pain medication as prescribed or ES Tylenol when needed.  May weight-bear as tolerated - use crutches or walker as needed. Follow-up in 10-14 days or as scheduled.   AMBULATORY SURGERY  DISCHARGE INSTRUCTIONS   The drugs that you were given will stay in your system until tomorrow so for the next 24 hours you should not:  Drive an automobile Make any legal decisions Drink any alcoholic beverage   You may resume regular meals tomorrow.  Today it is better to start with liquids and gradually work up to solid foods.  You may eat anything you prefer, but it is better to start with liquids, then soup and crackers, and gradually work up to solid foods.   Please notify your doctor immediately if you have any unusual bleeding, trouble breathing, redness and pain at the surgery site, drainage, fever, or pain not relieved by medication.    Your post-operative visit with Dr.                                       is: Date:                        Time:    Please call to schedule your post-operative visit.  Additional Instructions:

## 2021-06-03 NOTE — Transfer of Care (Signed)
Immediate Anesthesia Transfer of Care Note  Patient: Sheri Curtis  Procedure(s) Performed: RIGHT KNEE ARTHROSCOPY WITH DEBRIDEMENT AND PARTAIL MEDIAL AND PARTIAL LATERAL MENISCECTOMIES, MEDIAL MENISCUS REPAIR, ABRASION CHONDROPLASTY OF MEDIAL FEMORAL CONDYLE (Right: Knee)  Patient Location: PACU  Anesthesia Type:General  Level of Consciousness: sedated  Airway & Oxygen Therapy: Patient Spontanous Breathing and Patient connected to face mask oxygen  Post-op Assessment: Report given to RN and Post -op Vital signs reviewed and stable  Post vital signs: Reviewed and stable  Last Vitals:  Vitals Value Taken Time  BP 116/56 06/03/21 1534  Temp 36.3 C 06/03/21 1534  Pulse 107 06/03/21 1542  Resp 16 06/03/21 1542  SpO2 100 % 06/03/21 1542  Vitals shown include unvalidated device data.  Last Pain:  Vitals:   06/03/21 1534  TempSrc:   PainSc: Asleep      Patients Stated Pain Goal: 0 (70/17/79 3903)  Complications: No notable events documented.

## 2021-06-03 NOTE — Anesthesia Preprocedure Evaluation (Signed)
Anesthesia Evaluation  Patient identified by MRN, date of birth, ID band Patient awake    Reviewed: Allergy & Precautions, NPO status , Patient's Chart, lab work & pertinent test results  History of Anesthesia Complications Negative for: history of anesthetic complications  Airway Mallampati: II  TM Distance: >3 FB Neck ROM: Full    Dental  (+) Chipped   Pulmonary neg pulmonary ROS, neg sleep apnea, neg COPD, Patient abstained from smoking.Not current smoker,    Pulmonary exam normal breath sounds clear to auscultation       Cardiovascular Exercise Tolerance: Good METS(-) hypertension(-) CAD and (-) Past MI negative cardio ROS  (-) dysrhythmias  Rhythm:Regular Rate:Normal - Systolic murmurs    Neuro/Psych  Headaches, negative psych ROS   GI/Hepatic neg GERD  ,(+)     (-) substance abuse  ,   Endo/Other  neg diabetes  Renal/GU negative Renal ROS     Musculoskeletal  (+) Arthritis ,   Abdominal   Peds  Hematology   Anesthesia Other Findings Past Medical History: No date: Arthritis No date: Headache(784.0) No date: Heart disease No date: Hyperlipidemia No date: IBS (irritable bowel syndrome) 2004: Melanoma ([place])     Comment:  BACK No date: Sinus problem No date: Wears glasses  Reproductive/Obstetrics                             Anesthesia Physical Anesthesia Plan  ASA: 2  Anesthesia Plan: General   Post-op Pain Management: Ofirmev IV (intra-op) and Toradol IV (intra-op)   Induction: Intravenous  PONV Risk Score and Plan: 3 and Ondansetron, Dexamethasone and Midazolam  Airway Management Planned: LMA  Additional Equipment: None  Intra-op Plan:   Post-operative Plan: Extubation in OR  Informed Consent: I have reviewed the patients History and Physical, chart, labs and discussed the procedure including the risks, benefits and alternatives for the proposed anesthesia with  the patient or authorized representative who has indicated his/her understanding and acceptance.     Dental advisory given  Plan Discussed with: CRNA and Surgeon  Anesthesia Plan Comments: (Discussed risks of anesthesia with patient, including PONV, sore throat, lip/dental/eye damage. Rare risks discussed as well, such as cardiorespiratory and neurological sequelae, and allergic reactions. Discussed the role of CRNA in patient's perioperative care. Patient understands.)        Anesthesia Quick Evaluation

## 2021-06-04 ENCOUNTER — Encounter: Payer: Self-pay | Admitting: Surgery

## 2021-09-07 ENCOUNTER — Other Ambulatory Visit: Payer: Self-pay | Admitting: Surgery

## 2021-09-07 DIAGNOSIS — M19011 Primary osteoarthritis, right shoulder: Secondary | ICD-10-CM

## 2021-09-08 ENCOUNTER — Other Ambulatory Visit: Payer: Self-pay | Admitting: Surgery

## 2021-09-08 DIAGNOSIS — M19011 Primary osteoarthritis, right shoulder: Secondary | ICD-10-CM

## 2021-09-10 ENCOUNTER — Ambulatory Visit
Admission: RE | Admit: 2021-09-10 | Discharge: 2021-09-10 | Disposition: A | Discharge status: Home / Self Care | Payer: Medicare PPO | Source: Ambulatory Visit | Attending: Surgery | Admitting: Surgery

## 2021-09-10 DIAGNOSIS — I517 Cardiomegaly: Secondary | ICD-10-CM | POA: Insufficient documentation

## 2021-09-10 DIAGNOSIS — I700 Atherosclerosis of aorta: Secondary | ICD-10-CM | POA: Insufficient documentation

## 2021-09-10 DIAGNOSIS — M19011 Primary osteoarthritis, right shoulder: Secondary | ICD-10-CM

## 2021-09-10 DIAGNOSIS — R109 Unspecified abdominal pain: Secondary | ICD-10-CM | POA: Insufficient documentation

## 2021-09-10 DIAGNOSIS — R918 Other nonspecific abnormal finding of lung field: Secondary | ICD-10-CM | POA: Diagnosis not present

## 2021-12-24 ENCOUNTER — Other Ambulatory Visit: Payer: Self-pay | Admitting: Nurse Practitioner

## 2021-12-24 DIAGNOSIS — Z87898 Personal history of other specified conditions: Secondary | ICD-10-CM

## 2021-12-24 DIAGNOSIS — R1084 Generalized abdominal pain: Secondary | ICD-10-CM

## 2022-01-04 ENCOUNTER — Ambulatory Visit
Admission: RE | Admit: 2022-01-04 | Discharge: 2022-01-04 | Disposition: A | Discharge status: Home / Self Care | Payer: Medicare PPO | Source: Ambulatory Visit | Attending: Nurse Practitioner | Admitting: Nurse Practitioner

## 2022-01-04 DIAGNOSIS — Z87898 Personal history of other specified conditions: Secondary | ICD-10-CM | POA: Insufficient documentation

## 2022-01-04 DIAGNOSIS — R1084 Generalized abdominal pain: Secondary | ICD-10-CM | POA: Diagnosis present

## 2022-01-12 ENCOUNTER — Ambulatory Visit: Payer: Medicare PPO | Attending: Nurse Practitioner | Admitting: Physical Therapy

## 2022-01-12 ENCOUNTER — Encounter: Payer: Self-pay | Admitting: Physical Therapy

## 2022-01-12 ENCOUNTER — Ambulatory Visit: Payer: Medicare PPO | Admitting: Physical Therapy

## 2022-01-12 DIAGNOSIS — R278 Other lack of coordination: Secondary | ICD-10-CM | POA: Diagnosis present

## 2022-01-12 DIAGNOSIS — R109 Unspecified abdominal pain: Secondary | ICD-10-CM | POA: Insufficient documentation

## 2022-01-12 DIAGNOSIS — M6281 Muscle weakness (generalized): Secondary | ICD-10-CM | POA: Insufficient documentation

## 2022-01-12 NOTE — Therapy (Signed)
OUTPATIENT PHYSICAL THERAPY FEMALE PELVIC EVALUATION   Patient Name: Sheri Curtis MRN: [id number] DOB:Aug 21, 1955, 66 y.o., female Today's Date: 01/12/2022   PT End of Session - 01/12/22 1059     Visit Number 1    Number of Visits 12    Date for PT Re-Evaluation 04/06/22    Authorization Type IE 01/12/2022    PT Start Time 1110    PT Stop Time 1150    PT Time Calculation (min) 40 min    Activity Tolerance Patient tolerated treatment well    Behavior During Therapy WFL for tasks assessed/performed             Past Medical History:  Diagnosis Date   Arthritis    Headache(784.0)    Heart disease    Hyperlipidemia    IBS (irritable bowel syndrome)    Melanoma ([place]) 2004   BACK   Sinus problem    Wears glasses    Past Surgical History:  Procedure Laterality Date   ABDOMINAL HYSTERECTOMY     ASD REPAIR  1991   HEART REPAIR   IR FLUORO GUIDED NEEDLE PLC ASPIRATION/INJECTION LOC  01/28/2021   KNEE ARTHROSCOPY Right 06/03/2021   Procedure: RIGHT KNEE ARTHROSCOPY WITH DEBRIDEMENT AND PARTAIL MEDIAL AND PARTIAL LATERAL MENISCECTOMIES, MEDIAL MENISCUS REPAIR, ABRASION CHONDROPLASTY OF MEDIAL FEMORAL CONDYLE;  Surgeon: Corky Mull, MD;  Location: [facility] ORS;  Service: Orthopedics;  Laterality: Right;   OOPHORECTOMY Right    one ovary removed   pelvic reconstruction surgery  04/2019   There are no problems to display for this patient.   PCP: Baxter Hire, MD  REFERRING PROVIDER: Marval Regal, NP  REFERRING DIAG: [phone] (ICD-10-CM) - Mixed irritable bowel syndrome   THERAPY DIAG:  Abdominal pain in female  Other lack of coordination  Muscle weakness (generalized)  Rationale for Evaluation and Treatment Rehabilitation  PRECAUTIONS: None  WEIGHT BEARING RESTRICTIONS No  FALLS:  Has patient fallen in last 6 months? No  ONSET DATE: since childhood  SUBJECTIVE:                                                                                                                                                                                            CHIEF COMPLAINT: Patient notes that she may have a week of relief with controlled symptoms but then will have flare ups. Patient notes that most recent flare up has subsided some, but continues to deal. Patient knows that she has some food triggers: corn, dairy, spicy foods, greasy foods. Patient notes that she has lower abdominal pouches of swelling and bloating.    PERTINENT HISTORY/CHART  REVIEW:  Red flags (bowel/bladder changes, saddle paresthesia, personal history of cancer, h/o spinal tumors, h/o compression fx, h/o abdominal aneurysm, abdominal pain, chills/fever, night sweats, nausea, vomiting, unrelenting pain, first onset of insidious LBP <20 y/o): Negative  Patient presents with  Irritable Bowel Syndrome  Patient complains of IBS diarrhea alternating with constipation - chronic. Has had a flare for 3 weeks now (more diarrhea than constipation) also complains of fullness and bloating. Last colonoscopy/EGD 04/02/2018.   Irritable bowel syndrome with both constipation and diarrhea (primary encounter diagnosis) Intermittent generalized abdominal pain History of nausea and vomiting Early satiety   Pelvic floor physical therapy for difficulty passing stools  - Use the low-FODMAP diet for trigger identification. - Increase intake of soluble dietary fiber and water. - Try adding Citrucel 1-2 TBSP per day Drink 2 -3 more glasses of water or beverage of choice daily. Avoid carbonation.- Try a daily probiotic. - For diarrhea in the middle of a meal- try increasing Colestid 2 in the am and 1 in eve- monitor for constipation and cut back if it occurs.   PAIN:  Are you having pain? 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, traveling, reading,   PLOF:  Independent  PATIENT GOALS: "Be able to handle responsibilities without this getting in the way"  OBSTETRICAL HISTORY: G2P2 Deliveries: SVD Tearing/Episiotomy: P1 episiotomy   GYNECOLOGICAL HISTORY: Hysterectomy: Yes Abdominal Endometriosis: Negative Last Menstrual Period:  Pain with exam: No  Prolapse: Vault (surgical correction in 2020) Heaviness/pressure: No   UROLOGICAL HISTORY: Frequency of urination: every 3-4 hours Incontinence:  not since surgical intervention Fluid Intake: ~32 oz H20, iced tea with stevia caffeinated, cran-grape juice 8 oz Nocturia: 1-4x/night Toileting posture: feet flat and heels lifted Incomplete emptying: Yes in a flare up Pain with urination: Negative Stream: Strong and Weak(during flare up) Urgency: Yes (STS) Difficulty initiating urination: Positive for during flare up Intermittent stream: Positive for during flare up Frequent UTI: Negative.   GASTROINTESTINAL HISTORY: Type of bowel movement: (Bristol Stool Scale) 1-7 Frequency of BMs: diarrhea flare up will stay sitting on toilet for hours; every 4 days; 1x/day (good place) Incomplete bowel movement: Yes > 75% Pain with defecation: Positive Straining with defecation: Positive Hemorrhoids: Positive ; external; active Toileting posture: feet flat, heels elevated, repositioning Fiber supplement: Yes  Incontinence: Positive. (11/2021) Triggers: IBS flare up Amount: Complete Loss.  SEXUAL HISTORY AND FUNCTION: Patient expresses no concerns in this area.    OBJECTIVE:  DIAGNOSTIC TESTING/IMAGING: No cholelithiasis or sonographic evidence for acute cholecystitis.   No acute process.  Electronically Signed    By: Lovey Newcomer M.D.    On: 01/04/2022 12:44   IMPRESSION:  1. No CT abnormality in  the RIGHT supraclavicular region at the site  of patient concern. Consider further evaluation with ultrasound.  2. Indeterminate 4 mm lingular and 2 mm LEFT UPPER lobe nodules. No  follow-up needed if patient is low-risk (and has no known or  suspected primary neoplasm). Non-contrast chest CT can be considered  in 12 months if patient is high-risk. This recommendation follows  the consensus statement: Guidelines for Management of Incidental  Pulmonary Nodules Detected on CT Images: From the Fleischner Society  2017; Radiology 2017; [id number].  3. Mild cardiomegaly.  4.  Aortic Atherosclerosis (ICD10-I70.0).   Electronically Signed  By: Margarette Canada M.D.  On: 09/11/2021 20:39   COGNITION:  Patient is oriented to person, place, and time.  Recent memory is intact.  Remote memory is intact.  Attention span and concentration are intact.  Expressive speech is intact.  Patient's fund of knowledge is within normal limits for educational level.    POSTURE/OBSERVATIONS:   Lumbar lordosis: mildly diminished Thoracic kyphosis: mildly increased Iliac crest height: not formally assessed  Lumbar lateral shift: not formally assessed  Pelvic obliquity: not formally assessed  Leg length discrepancy: not formally assessed    GAIT: Patient ambulates with antalgic gait, decreased R knee flexion and decreased R stance time. Generally stiff gait throughout the cycle.    RANGE OF MOTION: deferred 2/2 to time constraints  AROM (Normal range in degrees) AROM  01/12/2022  Lumbar   Flexion (65)   Extension (30)   Right lateral flexion (25)   Left lateral flexion (25)   Right rotation (30)   Left rotation (30)       Hip LEFT RIGHT  Flexion (125)    Extension (15)    Abduction (40)    Adduction     Internal Rotation (45)    External Rotation (45)    (* = pain; blank rows = not tested)   SENSATION: deferred 2/2 to time constraints  Grossly intact to light touch bilateral LEs as  determined by testing dermatomes L2-S2 Proprioception and hot/cold testing deferred on this date   STRENGTH: MMT deferred 2/2 to time constraints   RLE LLE  Hip Flexion    Hip Extension    Hip Abduction     Hip Adduction     Hip ER     Hip IR     Knee Extension    Knee Flexion    Dorsiflexion     Plantarflexion (seated)    (* = pain; blank rows = not tested)   MUSCLE LENGTH: deferred 2/2 to time constraints   ABDOMINAL: deferred 2/2 to time constraints  Palpation: Diastasis: Scar mobility: Rib flare:   SPECIAL TESTS: deferred 2/2 to time constraints   PALPATION: deferred 2/2 to time constraints  LOCATION LEFT  RIGHT           Lumbar paraspinals    Quadratus Lumborum    Gluteus Maximus    Gluteus Medius    Deep hip external rotators    PSIS    Fortin's Area (SIJ)    Greater Trochanter    ASIS    Sacral border    Coccyx    Ischial tuberosity    (blank rows = not tested) Graded on 0-4 scale (0 = no pain, 1 = pain, 2 = pain with wincing/grimacing/flinching, 3 = pain with withdrawal, 4 = unwilling to allow palpation)   PHYSICAL PERFORMANCE MEASURES:  STS: WFL Deep Squat: RLE STS: LLE STS:  6MWT: 5TSTS:     EXTERNAL PELVIC EXAM: deferred 2/2 to time constraints None given; testing deferred to later date  Breath coordination: Voluntary Contraction: present/absent Relaxation: full/delayed/non-relaxing Perineal movement with sustained IAP increase ("bear down"): descent/no change/elevation/excessive descent Perineal movement with rapid IAP increase ("cough"): elevation/no change/descent Palpation of bulbocavernosus: Palpation of ischiocavernosus: Palpation of pubic symphysis: Palpation of superficial transverse perineal:   INTERNAL VAGINAL EXAM: deferred 2/2 to time constraints None given; testing deferred to later date  Introitus Appears:  Skin integrity:  Scar mobility: Strength (PERF):  Symmetry: Palpation: Prolapse: (0 no contraction,  1 flicker,  2 weak squeeze and no lift, 3 fair squeeze and definite lift, 4 good squeeze and lift against resistance, 5 strong squeeze against strong resistance)   RECTAL EXAM: not indicated None given; testing deferred to later date  Anal wink: present/absent Symmetry: Palpation: Strength (PERF):  (0 no contraction, 1 flicker, 2 weak squeeze and no lift, 3 fair squeeze and definite lift, 4 good squeeze and lift against resistance, 5 strong squeeze against strong resistance)   PATIENT EDUCATION:  Patient educated on prognosis, POC, and provided with HEP including: not initiated. Patient articulated understanding and returned demonstration. Patient will benefit from further education in order to maximize compliance and understanding for long-term therapeutic gains.   PATIENT SURVEYS:  FOTO Bowel Constipation 49, predicted 58 (11 visits) FOTO Bowel Leakage 58, predicted 65 (9 visits) PFDI Pain 25 PFDI Bowel 25  ASSESSMENT:  Clinical Impression: Patient is a 66 year old presenting to clinic with chief complaints of irritable bowel syndrome with constipation and diarrhea. Today's evaluation is suggestive of deficits in IAP management, PFM coordination, PFM strength, pain, and posture as evidenced by urinary and bowel urgency, straining with BMs, incomplete bowel and urinary emptying, 10/10 worst abdominal pain, and increased thoracic kyphosis. Patient's responses on FOTO outcome measures (Bowel Constipation 49, Bowel Leakage 58, PFDI Pain 25, PFDI Bowel 25) indicate significant functional limitations/disability/distress. Patient's progress may be limited due to the persistence of the complaint and comorbidities; however, patient's motivation is advantageous. Patient will benefit from continued skilled therapeutic intervention to address deficits in IAP management, PFM coordination, PFM strength, pain, and posture in order to increase function and improve overall QOL.   Objective impairments:  decreased coordination, decreased strength, improper body mechanics, postural dysfunction, and pain.   Activity limitations: meal prep, driving, and community activity.   Personal factors: Age, Behavior pattern, Past/current experiences, Time since onset of injury/illness/exacerbation, and 3+ comorbidities: arthritis, headache, heart disease, vginal vault prolapse, R knee miscal tear, mixed incontinence, hyperparathyroidism  are also affecting patient's functional outcome.   Rehab Potential: Fair    Clinical decision making: Evolving/moderate complexity  Evaluation complexity: Moderate   GOALS: Goals reviewed with patient? Yes  SHORT TERM GOALS: Target date: 02/23/2022  Patient will demonstrate independence with HEP in order to maximize therapeutic gains and improve carryover from physical therapy sessions to ADLs in the home and community. Baseline: not initiated Goal status: INITIAL   LONG TERM GOALS: Target date: 04/06/2022  Patient will demonstrate improved function as evidenced by a score of 65 on FOTO Bowel Leakage measure for full participation in activities at home and in the community.  Baseline: 58 Goal status: INITIAL  Patient will demonstrate improved function as evidenced by a score of 58 on FOTO measure for full participation in activities at home and in the community.  Baseline: 49 Goal status: INITIAL  Patient will demonstrate circumferential and sequential contraction of >3/5 MMT, > 5 sec hold x5 and 5 consecutive quick flicks with </= 10 min rest between testing bouts, and relaxation of the PFM coordinated with breath for improved management of intra-abdominal pressure and normal bowel and bladder function without the presence of pain nor incontinence in order to improve participation at home and in the community.  Baseline: not formally assessed  Goal status: INITIAL   PLAN: Rehab frequency: 1x/week  Rehab duration: 12 weeks  Planned interventions:  Therapeutic exercises, Therapeutic activity, Neuromuscular re-education, Balance training, Gait training, Patient/Family education, Self Care, Joint mobilization, Orthotic/Fit training, Electrical stimulation, Spinal mobilization, Cryotherapy, Moist heat,  scar mobilization, Taping, and Manual therapy     Myles Gip PT, DPT [phone]  01/12/2022, 11:00 AM

## 2022-01-19 ENCOUNTER — Ambulatory Visit: Payer: Medicare PPO | Admitting: Physical Therapy

## 2022-01-19 ENCOUNTER — Encounter: Payer: Self-pay | Admitting: Physical Therapy

## 2022-01-19 DIAGNOSIS — R278 Other lack of coordination: Secondary | ICD-10-CM

## 2022-01-19 DIAGNOSIS — R109 Unspecified abdominal pain: Secondary | ICD-10-CM

## 2022-01-19 DIAGNOSIS — M6281 Muscle weakness (generalized): Secondary | ICD-10-CM

## 2022-01-19 NOTE — Therapy (Signed)
OUTPATIENT PHYSICAL THERAPY FEMALE PELVIC TREATMENT   Patient Name: Sheri Curtis MRN: [id number] DOB:1955-12-20, 66 y.o., female Today's Date: 01/19/2022   PT End of Session - 01/19/22 1114     Visit Number 2    Number of Visits 12    Date for PT Re-Evaluation 04/06/22    Authorization Type IE 01/12/2022    PT Start Time 1115    PT Stop Time 1155    PT Time Calculation (min) 40 min    Activity Tolerance Patient tolerated treatment well    Behavior During Therapy WFL for tasks assessed/performed             Past Medical History:  Diagnosis Date   Arthritis    Headache(784.0)    Heart disease    Hyperlipidemia    IBS (irritable bowel syndrome)    Melanoma ([place]) 2004   BACK   Sinus problem    Wears glasses    Past Surgical History:  Procedure Laterality Date   ABDOMINAL HYSTERECTOMY     ASD REPAIR  1991   HEART REPAIR   IR FLUORO GUIDED NEEDLE PLC ASPIRATION/INJECTION LOC  01/28/2021   KNEE ARTHROSCOPY Right 06/03/2021   Procedure: RIGHT KNEE ARTHROSCOPY WITH DEBRIDEMENT AND PARTAIL MEDIAL AND PARTIAL LATERAL MENISCECTOMIES, MEDIAL MENISCUS REPAIR, ABRASION CHONDROPLASTY OF MEDIAL FEMORAL CONDYLE;  Surgeon: Corky Mull, MD;  Location: [facility] ORS;  Service: Orthopedics;  Laterality: Right;   OOPHORECTOMY Right    one ovary removed   pelvic reconstruction surgery  04/2019   There are no problems to display for this patient.   PCP: Baxter Hire, MD  REFERRING PROVIDER: Marval Regal, NP  REFERRING DIAG: [phone] (ICD-10-CM) - Mixed irritable bowel syndrome   THERAPY DIAG:  Abdominal pain in female  Other lack of coordination  Muscle weakness (generalized)  Rationale for Evaluation and Treatment Rehabilitation  PRECAUTIONS: None  WEIGHT BEARING RESTRICTIONS No  FALLS:  Has patient fallen in last 6 months? No  ONSET DATE: since childhood   Initial Evaluation:  SUBJECTIVE:                                                                                                                                                                                            CHIEF COMPLAINT: Patient notes that she may have a week of relief with controlled symptoms but then will have flare ups. Patient notes that most recent flare up has subsided some, but continues to deal. Patient knows that she has some food triggers: corn, dairy, spicy foods, greasy foods. Patient notes that she has lower abdominal pouches of swelling and bloating.  PERTINENT HISTORY/CHART REVIEW:  Red flags (bowel/bladder changes, saddle paresthesia, personal history of cancer, h/o spinal tumors, h/o compression fx, h/o abdominal aneurysm, abdominal pain, chills/fever, night sweats, nausea, vomiting, unrelenting pain, first onset of insidious LBP <20 y/o): Negative  Patient presents with  Irritable Bowel Syndrome  Patient complains of IBS diarrhea alternating with constipation - chronic. Has had a flare for 3 weeks now (more diarrhea than constipation) also complains of fullness and bloating. Last colonoscopy/EGD 04/02/2018.   Irritable bowel syndrome with both constipation and diarrhea (primary encounter diagnosis) Intermittent generalized abdominal pain History of nausea and vomiting Early satiety   Pelvic floor physical therapy for difficulty passing stools  - Use the low-FODMAP diet for trigger identification. - Increase intake of soluble dietary fiber and water. - Try adding Citrucel 1-2 TBSP per day Drink 2 -3 more glasses of water or beverage of choice daily. Avoid carbonation.- Try a daily probiotic. - For diarrhea in the middle of a meal- try increasing Colestid 2 in the am and 1 in eve- monitor for constipation and cut back if it occurs.   PAIN:  Are you having pain? 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, traveling, reading,   PLOF:  Independent  PATIENT GOALS: "Be able to handle responsibilities without this getting in the way"  OBSTETRICAL HISTORY: G2P2 Deliveries: SVD Tearing/Episiotomy: P1 episiotomy   GYNECOLOGICAL HISTORY: Hysterectomy: Yes Abdominal Endometriosis: Negative Last Menstrual Period:  Pain with exam: No  Prolapse: Vault (surgical correction in 2020) Heaviness/pressure: No   UROLOGICAL HISTORY: Frequency of urination: every 3-4 hours Incontinence:  not since surgical intervention Fluid Intake: ~32 oz H20, iced tea with stevia caffeinated, cran-grape juice 8 oz Nocturia: 1-4x/night Toileting posture: feet flat and heels lifted Incomplete emptying: Yes in a flare up Pain with urination: Negative Stream: Strong and Weak(during flare up) Urgency: Yes (STS) Difficulty initiating urination: Positive for during flare up Intermittent stream: Positive for during flare up Frequent UTI: Negative.   GASTROINTESTINAL HISTORY: Type of bowel movement: (Bristol Stool Scale) 1-7 Frequency of BMs: diarrhea flare up will stay sitting on toilet for hours; every 4 days; 1x/day (good place) Incomplete bowel movement: Yes > 75% Pain with defecation: Positive Straining with defecation: Positive Hemorrhoids: Positive ; external; active Toileting posture: feet flat, heels elevated, repositioning Fiber supplement: Yes  Incontinence: Positive. (11/2021) Triggers: IBS flare up Amount: Complete Loss.  SEXUAL HISTORY AND FUNCTION: Patient expresses no concerns in this area.    OBJECTIVE:  DIAGNOSTIC TESTING/IMAGING: No cholelithiasis or sonographic evidence for acute cholecystitis.   No acute process.  Electronically Signed    By: Lovey Newcomer M.D.    On: 01/04/2022 12:44   IMPRESSION:   1. No CT abnormality in the RIGHT supraclavicular region at the site  of patient concern. Consider further evaluation with ultrasound.  2. Indeterminate 4 mm lingular and 2 mm LEFT UPPER lobe nodules. No  follow-up needed if patient is low-risk (and has no known or  suspected primary neoplasm). Non-contrast chest CT can be considered  in 12 months if patient is high-risk. This recommendation follows  the consensus statement: Guidelines for Management of Incidental  Pulmonary Nodules Detected on CT Images: From the Fleischner Society  2017; Radiology 2017; 284:228-243.  3. Mild cardiomegaly.  4. Aortic Atherosclerosis (ICD10-I70.0).   Electronically Signed  By: Margarette Canada M.D.  On: 09/11/2021 20:39   COGNITION:  Patient is oriented to person, place, and time.  Recent memory is intact.  Remote memory is intact.  Attention span and concentration are intact.  Expressive speech is intact.  Patient's fund of knowledge is within normal limits for educational level.    POSTURE/OBSERVATIONS:   Lumbar lordosis: mildly diminished Thoracic kyphosis: mildly increased Iliac crest height: not formally assessed  Lumbar lateral shift: not formally assessed  Pelvic obliquity: not formally assessed  Leg length discrepancy: not formally assessed    GAIT: Patient ambulates with antalgic gait, decreased R knee flexion and decreased R stance time. Generally stiff gait throughout the cycle.   TREATMENT SUBJECTIVE: Patient notes no significant changes since last session. PAIN: 1/10 "just uncomfortable"  Pre-treatment assessment:  RANGE OF MOTION:   AROM (Normal range in degrees) AROM  01/19/2022  Lumbar   Flexion (65) WNL  Extension (30) WNL  Right lateral flexion (25) WNL  Left lateral flexion (25) WNL  Right rotation (30) WNL  Left rotation (30) WNL      Hip LEFT RIGHT  Flexion (125) WNL WNL  Extension (15) WNL WNL  Abduction (40) WNL WNL  Adduction  WNL WNL  Internal Rotation  (45) WNL WNL  External Rotation (45) WNL WNL  (* = pain; blank rows = not tested)   STRENGTH: MMT    RLE LLE  Hip Flexion 5 5  Hip Extension    Hip Abduction  5 5  Hip Adduction  5 5  Hip ER  5* 5  Hip IR  5 5  Knee Extension    Knee Flexion    Dorsiflexion     Plantarflexion (seated)    (* = pain; blank rows = not tested)  ABDOMINAL:   Palpation: TTP over RLQ and LLQ Diastasis: none noted Scar mobility: Rib flare: mild  EXTERNAL PELVIC EXAM:  Patient educated on the purpose of the procedure/exam and articulated understanding and consented to the procedure/exam. and verbal  Breath coordination: present Voluntary Contraction: present Relaxation: delayed Perineal movement with sustained IAP increase ("bear down"): elevation Perineal movement with rapid IAP increase ("cough"): descent  Manual Therapy: Colon massage with patient education on GI anatomy and typical function. Patient provided with handout for home practice.   Neuromuscular Re-education: Supine knee to chest with PFM lengthening, BLE, for improved PFM spasm release Supine double knee to chest with PFM lengthening for improved PFM spasm release Supine butterfly with PFM lengthening, BLE, for improved PFM tissue length   Therapeutic Exercise:   Treatments unbilled:  Post-treatment assessment:  Patient educated throughout session on appropriate technique and form using multi-modal cueing, HEP, and activity modification. Patient articulated understanding and returned demonstration.  Patient Response to interventions:      PATIENT SURVEYS:  FOTO Bowel Constipation 49, predicted 58 (11 visits) FOTO Bowel Leakage 58, predicted 65 (9 visits) PFDI Pain 25 PFDI Bowel 25  ASSESSMENT:  Clinical Impression: Patient presents to clinic with excellent motivation to participate in therapy. Patient demonstrates deficits in IAP management, PFM coordination, PFM strength, pain, and posture. Patient able to  achieve understanding of colon massage for improved regulation of BMs during today's session and responded positively to PFM downtraining interventions. Patient will benefit from continued skilled therapeutic intervention to address remaining deficits in IAP management, PFM coordination, PFM strength, pain, and posture in order to increase function and improve overall QOL.   Objective  impairments: decreased coordination, decreased strength, improper body mechanics, postural dysfunction, and pain.   Activity limitations: meal prep, driving, and community activity.   Personal factors: Age, Behavior pattern, Past/current experiences, Time since onset of injury/illness/exacerbation, and 3+ comorbidities: arthritis, headache, heart disease, vginal vault prolapse, R knee miscal tear, mixed incontinence, hyperparathyroidism  are also affecting patient's functional outcome.   Rehab Potential: Fair    Clinical decision making: Evolving/moderate complexity  Evaluation complexity: Moderate   GOALS: Goals reviewed with patient? Yes  SHORT TERM GOALS: Target date: 02/23/2022  Patient will demonstrate independence with HEP in order to maximize therapeutic gains and improve carryover from physical therapy sessions to ADLs in the home and community. Baseline: not initiated Goal status: INITIAL   LONG TERM GOALS: Target date: 04/06/2022  Patient will demonstrate improved function as evidenced by a score of 65 on FOTO Bowel Leakage measure for full participation in activities at home and in the community.  Baseline: 58 Goal status: INITIAL  Patient will demonstrate improved function as evidenced by a score of 58 on FOTO measure for full participation in activities at home and in the community.  Baseline: 49 Goal status: INITIAL  Patient will demonstrate circumferential and sequential contraction of >3/5 MMT, > 5 sec hold x5 and 5 consecutive quick flicks with </= 10 min rest between testing bouts,  and relaxation of the PFM coordinated with breath for improved management of intra-abdominal pressure and normal bowel and bladder function without the presence of pain nor incontinence in order to improve participation at home and in the community.  Baseline: not formally assessed  Goal status: INITIAL   PLAN: Rehab frequency: 1x/week  Rehab duration: 12 weeks  Planned interventions: Therapeutic exercises, Therapeutic activity, Neuromuscular re-education, Balance training, Gait training, Patient/Family education, Self Care, Joint mobilization, Orthotic/Fit training, Electrical stimulation, Spinal mobilization, Cryotherapy, Moist heat, scar mobilization, Taping, and Manual therapy     Myles Gip PT, DPT [phone]  01/19/2022, 11:15 AM

## 2022-01-26 ENCOUNTER — Encounter: Payer: Medicare PPO | Admitting: Physical Therapy

## 2022-02-02 ENCOUNTER — Encounter: Payer: Medicare PPO | Admitting: Physical Therapy

## 2022-02-09 ENCOUNTER — Encounter: Payer: Medicare PPO | Admitting: Physical Therapy

## 2022-02-16 IMAGING — MG MM DIGITAL SCREENING BILAT W/ TOMO AND CAD
6 of 10 series · 6 of 30 positions shown · non-contrast
Comparison: Previous exam(s).

CLINICAL DATA: Screening.

EXAM:
DIGITAL SCREENING BILATERAL MAMMOGRAM WITH TOMOSYNTHESIS AND CAD
TECHNIQUE: Bilateral screening digital craniocaudal and mediolateral oblique
mammograms were obtained. Bilateral screening digital breast
tomosynthesis was performed. The images were evaluated with
computer-aided detection.

[L MLO synth-2D]
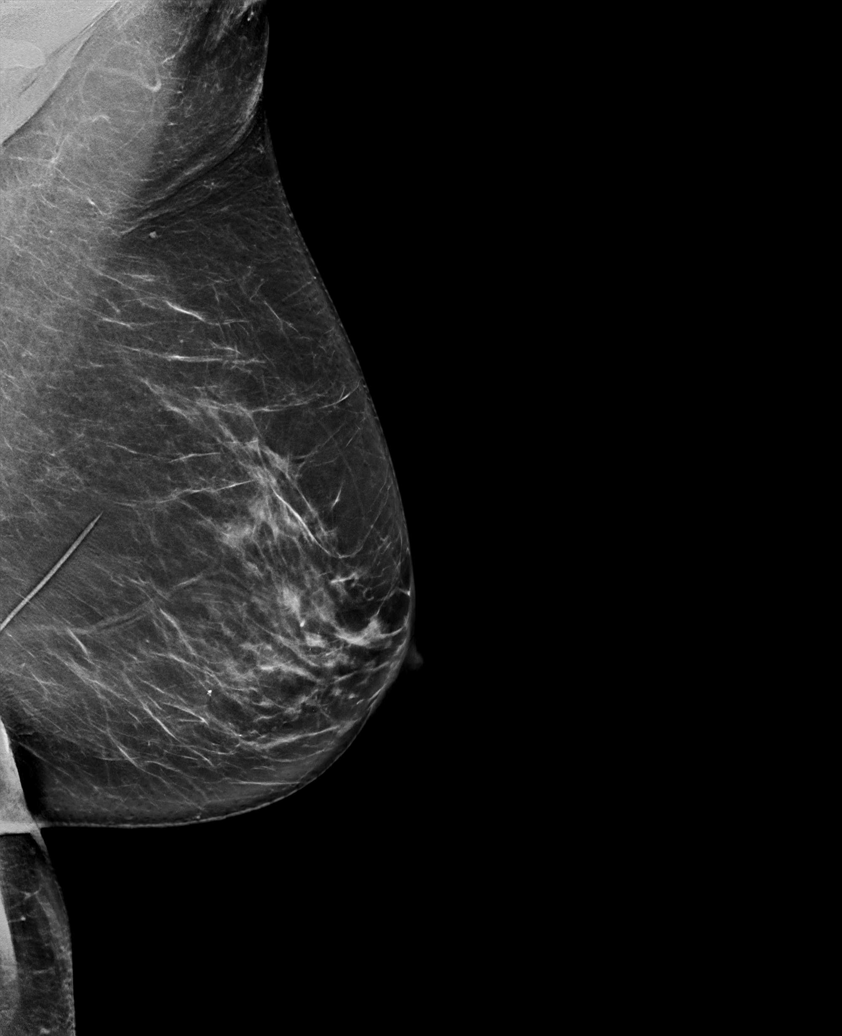

[R CC synth-2D]
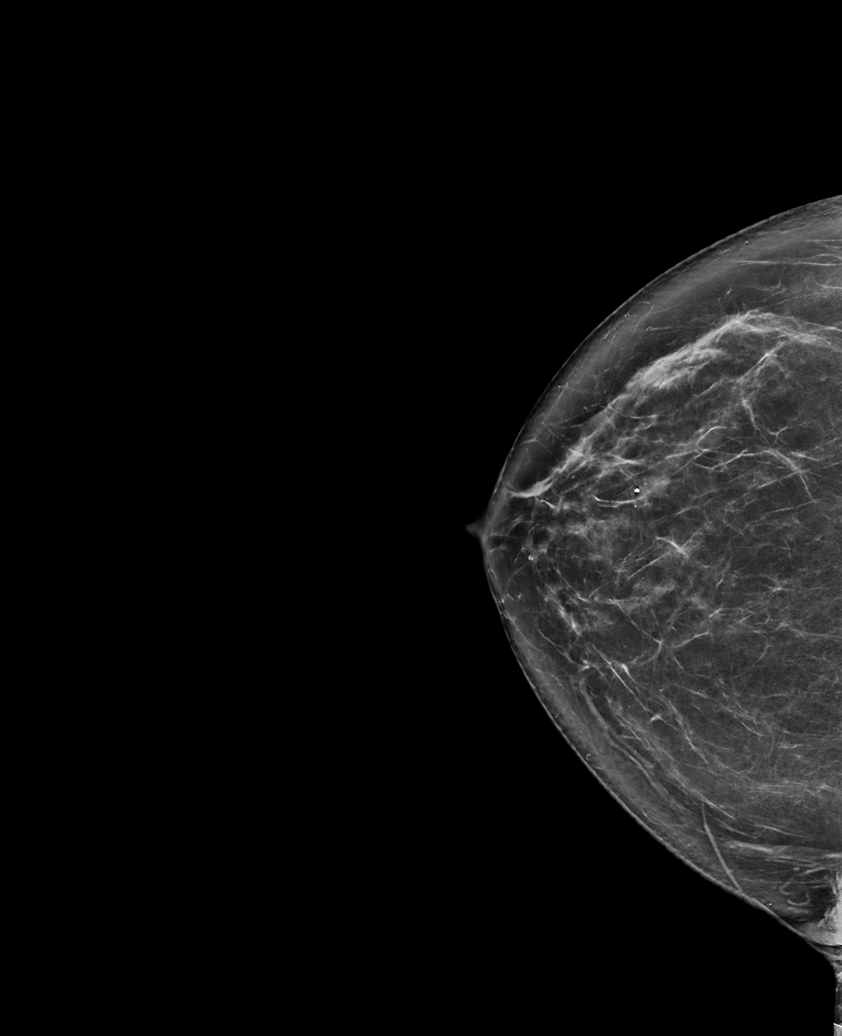

[L CC synth-2D]
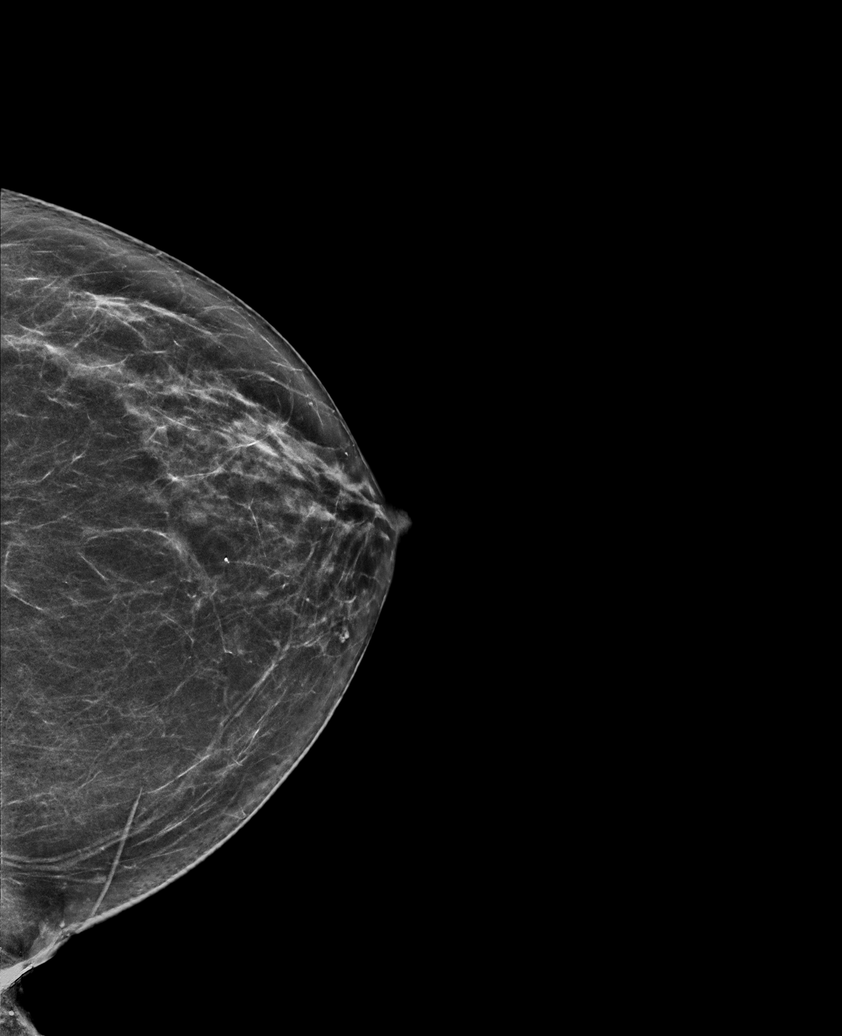

[R MLO synth-2D (1 of 2)]
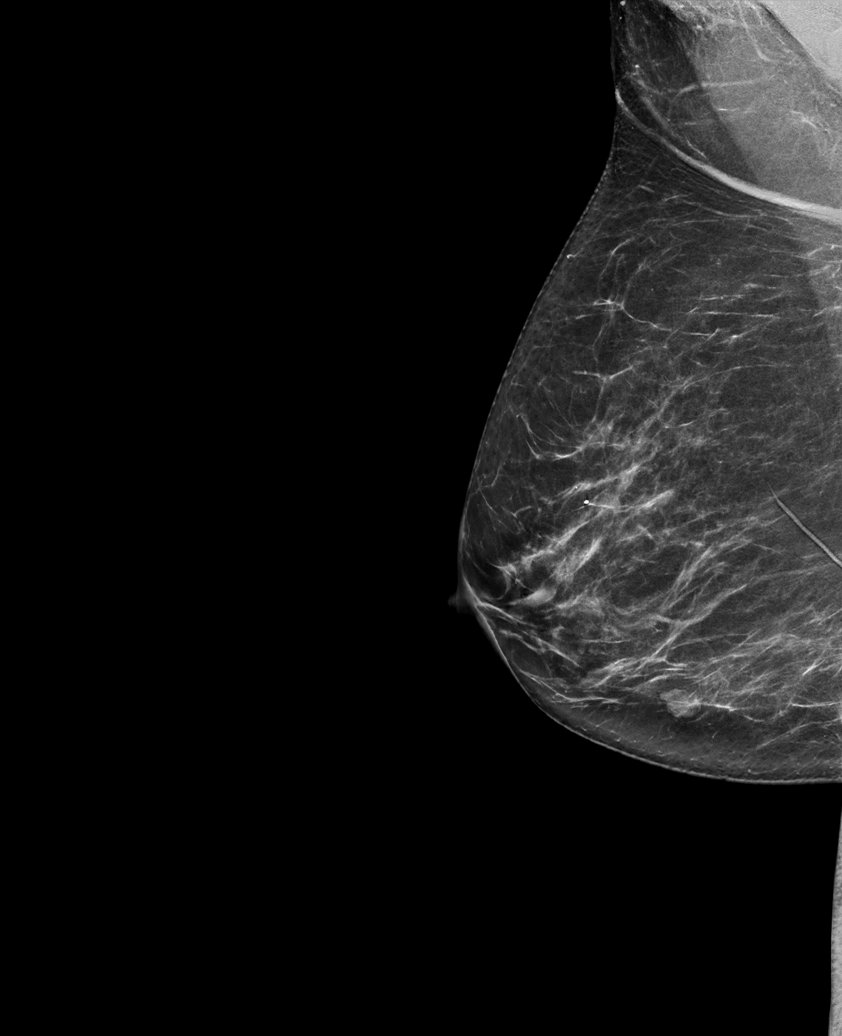

[R MLO synth-2D (2 of 2)]
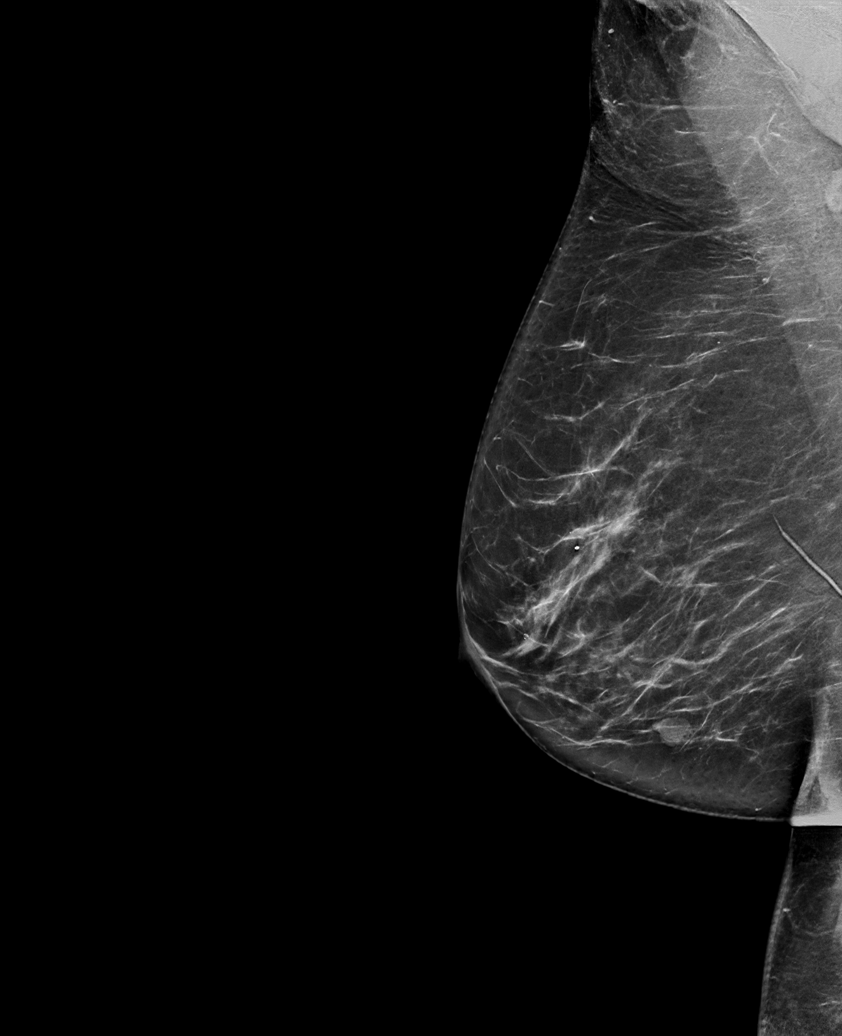

[L MLO tomo · tomo slice 40/79.0]
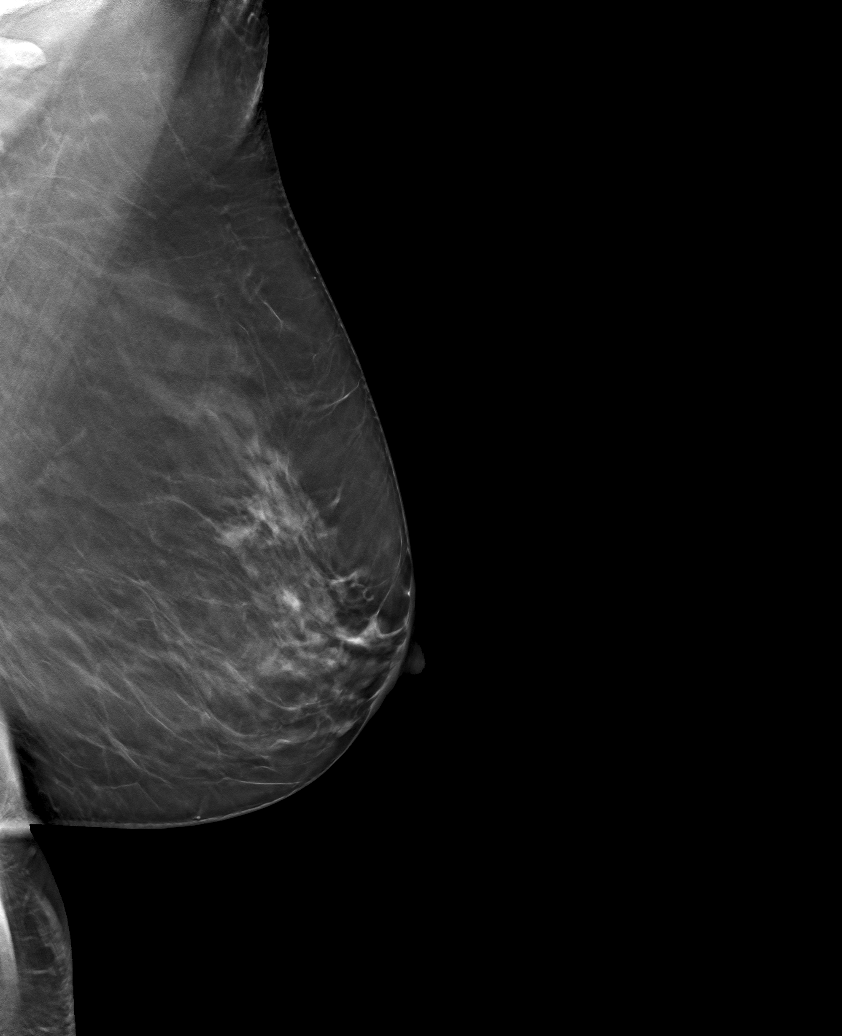

[6 of 30 positions shown; findings below may reference images not displayed]

ACR Breast Density Category b: There are scattered areas of
fibroglandular density.
FINDINGS: There are no findings suspicious for malignancy.
IMPRESSION: No mammographic evidence of malignancy. A result letter of this
screening mammogram will be mailed directly to the patient.

RECOMMENDATION:
Screening mammogram in one year. (Code:51-O-LD2)

BI-RADS CATEGORY  1: Negative.

## 2022-02-23 ENCOUNTER — Encounter: Payer: Medicare PPO | Admitting: Physical Therapy

## 2022-03-02 ENCOUNTER — Encounter: Payer: Medicare PPO | Admitting: Physical Therapy

## 2022-03-09 ENCOUNTER — Encounter: Payer: Medicare PPO | Admitting: Physical Therapy

## 2022-05-11 ENCOUNTER — Other Ambulatory Visit: Payer: Self-pay | Admitting: Internal Medicine

## 2022-05-11 DIAGNOSIS — Z1231 Encounter for screening mammogram for malignant neoplasm of breast: Secondary | ICD-10-CM

## 2022-06-16 ENCOUNTER — Ambulatory Visit
Admission: RE | Admit: 2022-06-16 | Discharge: 2022-06-16 | Disposition: A | Discharge status: Home / Self Care | Payer: Medicare PPO | Source: Ambulatory Visit | Attending: Internal Medicine | Admitting: Internal Medicine

## 2022-06-16 DIAGNOSIS — Z1231 Encounter for screening mammogram for malignant neoplasm of breast: Secondary | ICD-10-CM | POA: Insufficient documentation

## 2022-07-01 IMAGING — CT CT ABDOMEN W/O CM
2 of 4 series · 12 of 46 positions shown, 14 images · non-contrast
Comparison: 01/18/2010 abdominopelvic CT.

CLINICAL DATA: 66-year-old female with palpable thickening in the
RIGHT supraclavicular region. Abdominal discomfort and reflux.

EXAM:
CT CHEST AND ABDOMEN WITHOUT CONTRAST
TECHNIQUE: Multidetector CT imaging of the chest and abdomen was performed
following the standard protocol without intravenous contrast.
RADIATION DOSE REDUCTION: This exam was performed according to the
departmental dose-optimization program which includes automated
exposure control, adjustment of the mA and/or kV according to
patient size and/or use of iterative reconstruction technique.

[Series 2: axial st · axial · 0.66mm/px · z∈[-782,-462]mm · 9 of 81 slices shown, 11 images]
[im 9/81  soft-tissue]
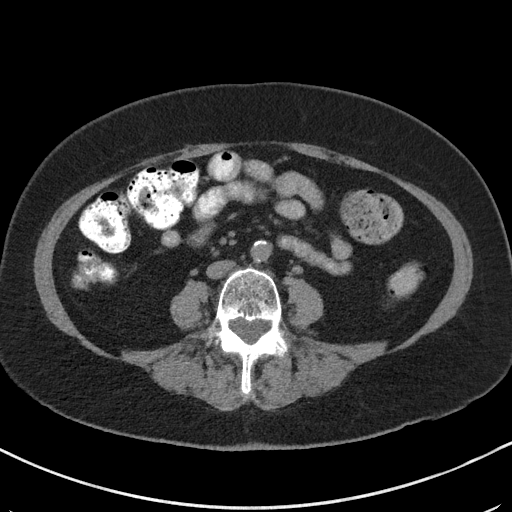
[im 9/81  bone]
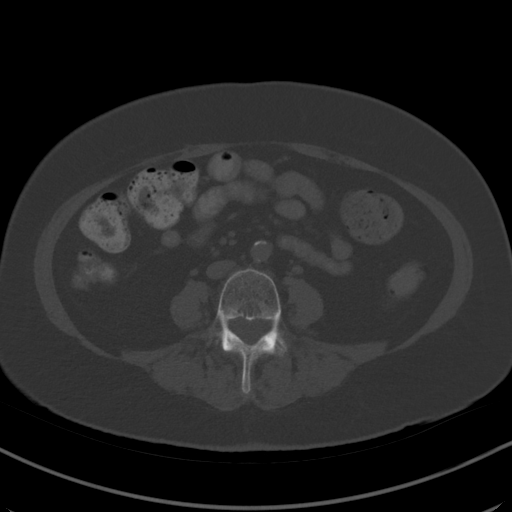
[im 17/81  soft-tissue]
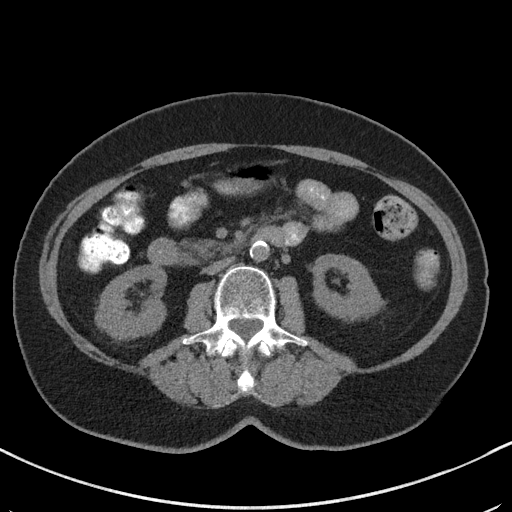
[im 25/81  soft-tissue]
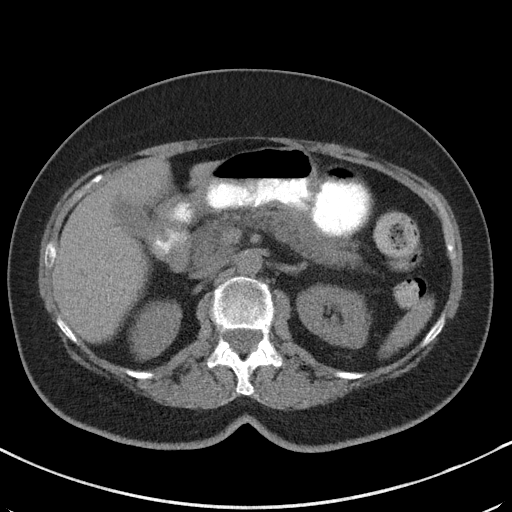
[im 33/81  soft-tissue]
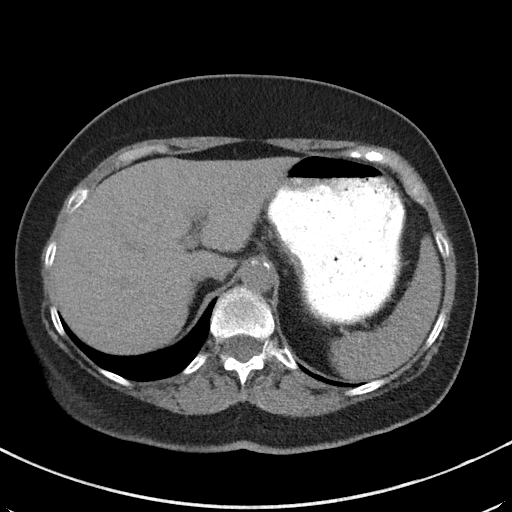
[im 41/81  soft-tissue]
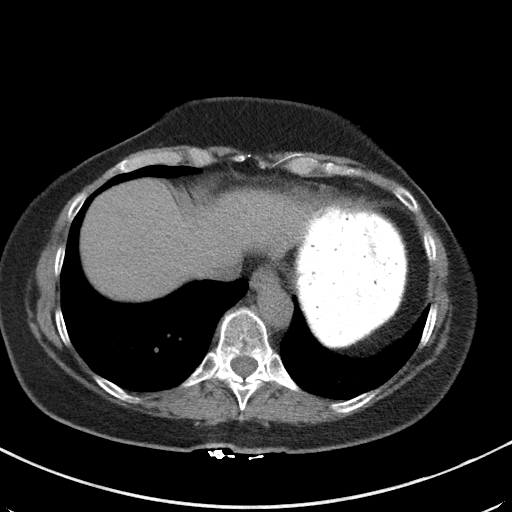
[im 49/81  soft-tissue]
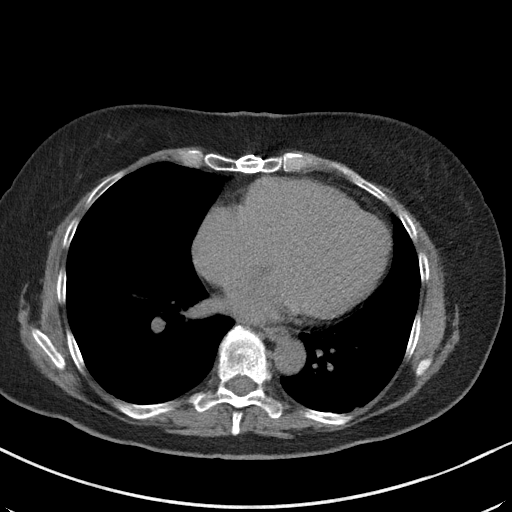
[im 57/81  soft-tissue]
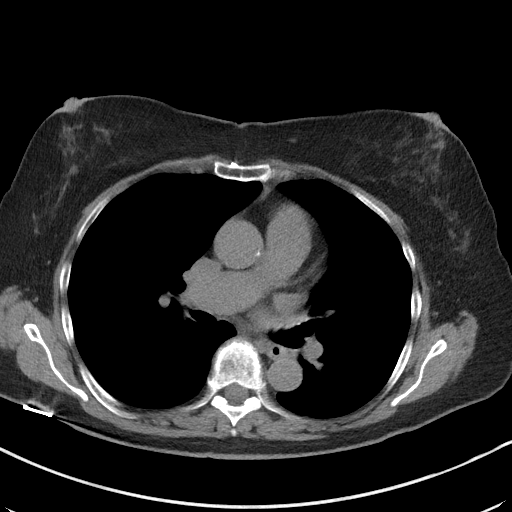
[im 65/81  soft-tissue]
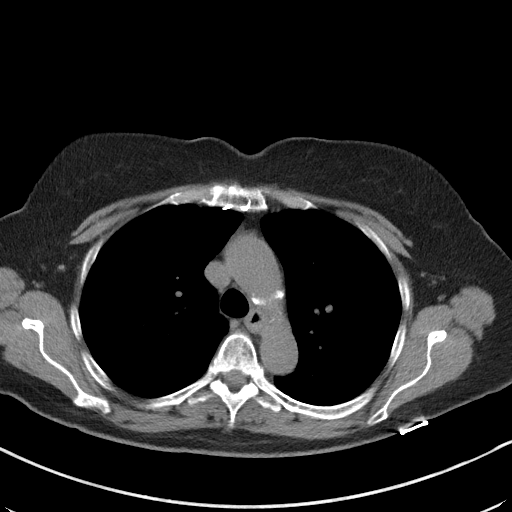
[im 73/81  soft-tissue]
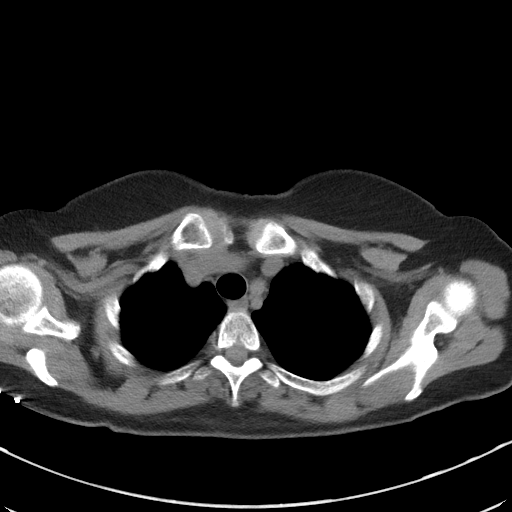
[im 73/81  bone]
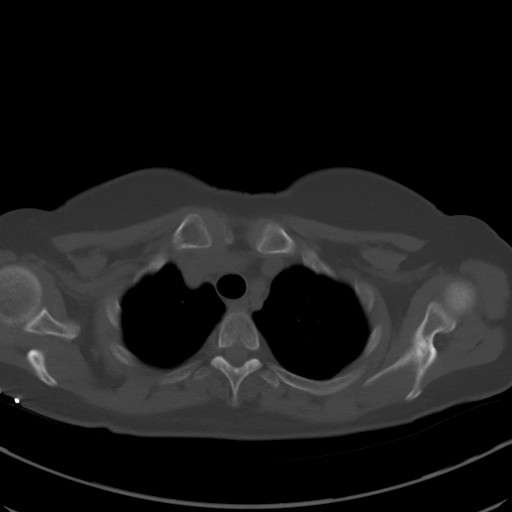

[Series 5: coronal · coronal · 0.65mm/px · 3 of 130 slices shown]
[im 44/130  soft-tissue]
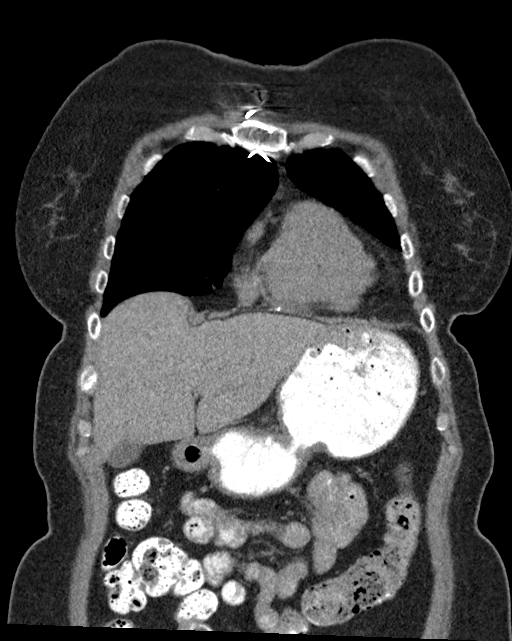
[im 58/130  soft-tissue]
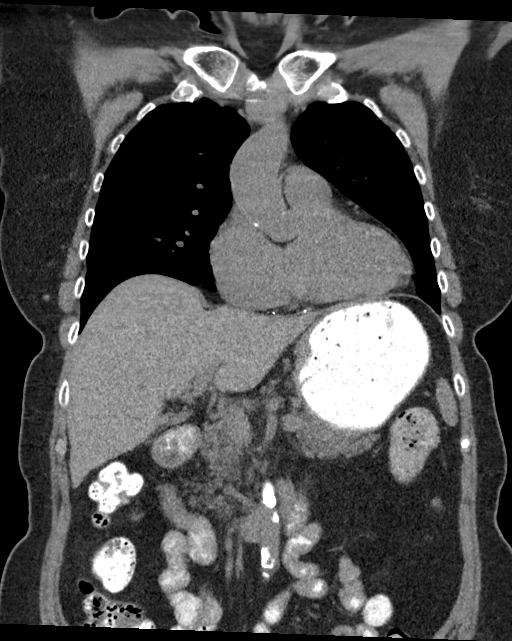
[im 72/130  soft-tissue]
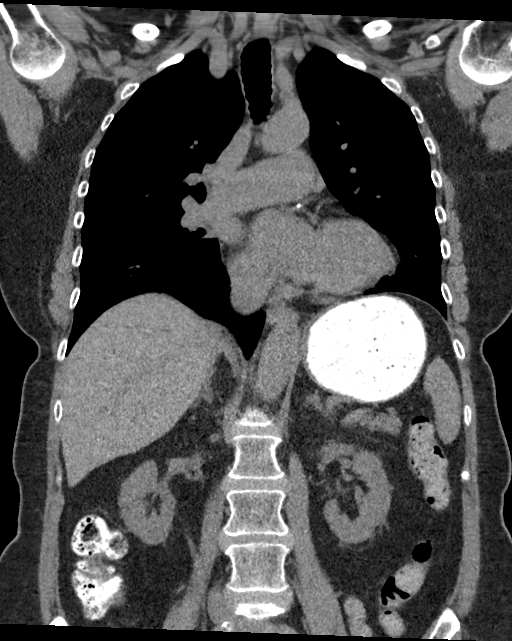

[12 of 46 positions shown; findings below may reference images not displayed]

FINDINGS: Please note that parenchymal and vascular abnormalities may be
missed as intravenous contrast was not administered.

CT CHEST FINDINGS WITHOUT CONTRAST

Cardiovascular: Mild cardiomegaly and median sternotomy identified.
An inferior epicardial pacing wire is unchanged from 7088 without
adjacent inflammation or fluid.

Mild aortic atherosclerotic calcifications noted without aneurysm.
No pericardial effusion identified.

Mediastinum/Nodes: There is no RIGHT supraclavicular abnormality at
the bite a min E marker, placed at the site of patient concern.

No enlarged lymph nodes, mediastinal mass or focal collection
identified. The visualized thyroid, trachea and esophagus are
unremarkable.

Lungs/Pleura: A 4 mm lingular nodule (image 68: Series 4) and a 2 mm
posterior LEFT UPPER lobe nodule ([DATE]) are identified with these
areas previously not imaged.

A 3 mm RIGHT LOWER lobe nodule ([DATE]) is unchanged from 7088 and
considered benign. Minimal subsegmental atelectasis/scarring within
the LEFT lung and RIGHT middle lobe noted.

There is no evidence of airspace disease, mass, consolidation,
pleural effusion or pneumothorax.

Musculoskeletal: No acute or suspicious bony abnormalities are
identified. Degenerative disc disease with endplate sclerosis is
identified from T6-T9.

CT ABDOMEN FINDINGS WITHOUT CONTRAST

Hepatobiliary: The liver and gallbladder are unremarkable. There is
no evidence of intrahepatic or extrahepatic biliary dilatation.

Pancreas: Unremarkable

Spleen: Unremarkable

Adrenals/Urinary Tract: The kidneys and adrenal glands are
unremarkable.

Stomach/Bowel: The visualized bowel loops are unremarkable except
for a stable 1 cm duodenal lipoma. No bowel wall thickening,
evidence of bowel obstruction or inflammatory changes are noted. No
definite hiatal hernia noted.

Vascular/Lymphatic: Aortic atherosclerosis. No enlarged abdominal or
pelvic lymph nodes.

Other: No ascites, pneumoperitoneum or focal collection identified.

Musculoskeletal: No acute or suspicious bony abnormalities are
noted.
IMPRESSION: 1. No CT abnormality in the RIGHT supraclavicular region at the site
of patient concern. Consider further evaluation with ultrasound.
2. Indeterminate 4 mm lingular and 2 mm LEFT UPPER lobe nodules. No
follow-up needed if patient is low-risk (and has no known or
suspected primary neoplasm). Non-contrast chest CT can be considered
in 12 months if patient is high-risk. This recommendation follows
the consensus statement: Guidelines for Management of Incidental
Pulmonary Nodules Detected on CT Images: From the [HOSPITAL]
3. Mild cardiomegaly.
4. Aortic Atherosclerosis ([id number]).

## 2022-07-27 ENCOUNTER — Other Ambulatory Visit: Payer: Self-pay | Admitting: Surgery

## 2022-08-02 ENCOUNTER — Encounter
Admission: RE | Admit: 2022-08-02 | Discharge: 2022-08-02 | Disposition: A | Discharge status: Home / Self Care | Payer: Medicare PPO | Source: Ambulatory Visit | Attending: Surgery | Admitting: Surgery

## 2022-08-02 DIAGNOSIS — Z01818 Encounter for other preprocedural examination: Secondary | ICD-10-CM | POA: Diagnosis present

## 2022-08-02 DIAGNOSIS — Z01812 Encounter for preprocedural laboratory examination: Secondary | ICD-10-CM

## 2022-08-02 DIAGNOSIS — Z01810 Encounter for preprocedural cardiovascular examination: Secondary | ICD-10-CM | POA: Diagnosis not present

## 2022-08-02 HISTORY — DX: Hyperparathyroidism, unspecified: E21.3

## 2022-08-02 HISTORY — DX: Other specified disorders of bone density and structure, unspecified site: M85.80

## 2022-08-02 LAB — COMPREHENSIVE METABOLIC PANEL
ALT: 24 U/L (ref 0–44)
AST: 25 U/L (ref 15–41)
Albumin: 4.1 g/dL (ref 3.5–5.0)
Alkaline Phosphatase: 90 U/L (ref 38–126)
Anion gap: 10 (ref 5–15)
BUN: 16 mg/dL (ref 8–23)
CO2: 27 mmol/L (ref 22–32)
Calcium: 10.3 mg/dL (ref 8.9–10.3)
Chloride: 105 mmol/L (ref 98–111)
Creatinine, Ser: 0.59 mg/dL (ref 0.44–1.00)
GFR, Estimated: >60 mL/min (ref >60)
Glucose, Bld: 84 mg/dL (ref 70–99)
Potassium: 3.7 mmol/L (ref 3.5–5.1)
Sodium: 142 mmol/L (ref 135–145)
Total Bilirubin: 0.7 mg/dL (ref 0.3–1.2)
Total Protein: 7.1 g/dL (ref 6.5–8.1)

## 2022-08-02 LAB — URINALYSIS, ROUTINE W REFLEX MICROSCOPIC
Bacteria, UA: NONE SEEN
Bilirubin Urine: NEGATIVE
Glucose, UA: NEGATIVE mg/dL
Hgb urine dipstick: NEGATIVE
Ketones, ur: NEGATIVE mg/dL
Nitrite: NEGATIVE
Protein, ur: NEGATIVE mg/dL
Specific Gravity, Urine: 1.006 (ref 1.005–1.030)
pH: 5 (ref 5.0–8.0)

## 2022-08-02 LAB — CBC WITH DIFFERENTIAL/PLATELET
Abs Immature Granulocytes: 0.01 10*3/uL (ref 0.00–0.07)
Basophils Absolute: 0.1 10*3/uL (ref 0.0–0.1)
Basophils Relative: 1 %
Eosinophils Absolute: 0.1 10*3/uL (ref 0.0–0.5)
Eosinophils Relative: 3 %
HCT: 44.7 % (ref 36.0–46.0)
Hemoglobin: 15.3 g/dL — ABNORMAL HIGH (ref 12.0–15.0)
Immature Granulocytes: 0 %
Lymphocytes Relative: 32 %
Lymphs Abs: 1.4 10*3/uL (ref 0.7–4.0)
MCH: 32 pg (ref 26.0–34.0)
MCHC: 34.2 g/dL (ref 30.0–36.0)
MCV: 93.5 fL (ref 80.0–100.0)
Monocytes Absolute: 0.4 10*3/uL (ref 0.1–1.0)
Monocytes Relative: 8 %
Neutro Abs: 2.5 10*3/uL (ref 1.7–7.7)
Neutrophils Relative %: 56 %
Platelets: 265 10*3/uL (ref 150–400)
RBC: 4.78 MIL/uL (ref 3.87–5.11)
RDW: 12.6 % (ref 11.5–15.5)
WBC: 4.5 10*3/uL (ref 4.0–10.5)
nRBC: 0 % (ref 0.0–0.2)

## 2022-08-02 LAB — TYPE AND SCREEN
ABO/RH(D): O POS
Antibody Screen: NEGATIVE

## 2022-08-02 LAB — SURGICAL PCR SCREEN
MRSA, PCR: NEGATIVE
Staphylococcus aureus: NEGATIVE

## 2022-08-02 NOTE — Patient Instructions (Signed)
Your procedure is scheduled on: Thursday, April 4 Report to the Registration Desk on the 1st floor of the [facility]. To find out your arrival time, please call [phone] between 1PM - 3PM on: Wednesday, April 3 If your arrival time is 6:00 am, do not arrive before that time as the [place] entrance doors do not open until 6:00 am.  REMEMBER: Instructions that are not followed completely may result in serious medical risk, up to and including death; or upon the discretion of your surgeon and anesthesiologist your surgery may need to be rescheduled.  Do not eat food after midnight the night before surgery.  No gum chewing or hard candies.  You may however, drink CLEAR liquids up to 2 hours before you are scheduled to arrive for your surgery. Do not drink anything within 2 hours of your scheduled arrival time.  Clear liquids include: - water  - apple juice without pulp - gatorade (not RED colors) - black coffee or tea (Do NOT add milk or creamers to the coffee or tea) Do NOT drink anything that is not on this list.  In addition, your doctor has ordered for you to drink the provided:  Ensure Pre-Surgery Clear Carbohydrate Drink  Drinking this carbohydrate drink up to two hours before surgery helps to reduce insulin resistance and improve patient outcomes. Please complete drinking 2 hours before scheduled arrival time.  One week prior to surgery: starting March 28 Stop Anti-inflammatories (NSAIDS) such as Advil, Aleve, Ibuprofen, Motrin, Naproxen, Naprosyn and Aspirin based products such as Excedrin, Goody's Powder, BC Powder. Stop ANY OVER THE COUNTER supplements until after surgery. Stop vitamin D You may however, continue to take Tylenol if needed for pain up until the day of surgery.  Continue taking all prescribed medications  DO NOT TAKE ANY MEDICATIONS THE MORNING OF SURGERY   No Alcohol for 24 hours before or after surgery.  No Smoking including e-cigarettes for 24  hours before surgery.  No chewable tobacco products for at least 6 hours before surgery.  No nicotine patches on the day of surgery.  Do not use any "recreational" drugs for at least a week (preferably 2 weeks) before your surgery.  Please be advised that the combination of cocaine and anesthesia may have negative outcomes, up to and including death. If you test positive for cocaine, your surgery will be cancelled.  On the morning of surgery brush your teeth with toothpaste and water, you may rinse your mouth with mouthwash if you wish. Do not swallow any toothpaste or mouthwash.  Use CHG Soap as directed on instruction sheet.  Do not wear jewelry, make-up, hairpins, clips or nail polish.  Do not wear lotions, powders, or perfumes.   Do not shave body hair from the neck down 48 hours before surgery.  Contact lenses, hearing aids and dentures may not be worn into surgery.  Do not bring valuables to the hospital. [facility] is not responsible for any missing/lost belongings or valuables.   Notify your doctor if there is any change in your medical condition (cold, fever, infection).  Wear comfortable clothing (specific to your surgery type) to the hospital.  After surgery, you can help prevent lung complications by doing breathing exercises.  Take deep breaths and cough every 1-2 hours. Your doctor may order a device called an Incentive Spirometer to help you take deep breaths.  If you are being admitted to the hospital overnight, leave your suitcase in the car. After surgery it may  be brought to your room.  In case of increased patient census, it may be necessary for you, the patient, to continue your postoperative care in the Same Day Surgery department.  If you are being discharged the day of surgery, you will not be allowed to drive home. You will need a responsible individual to drive you home and stay with you for 24 hours after surgery.   If you are taking public  transportation, you will need to have a responsible individual with you.  Please call the [place] Dept. at [phone] if you have any questions about these instructions.  Surgery Visitation Policy:  Patients having surgery or a procedure may have two visitors.  Children under the age of 25 must have an adult with them who is not the patient.  Inpatient Visitation:    Visiting hours are 7 a.m. to 8 p.m. Up to four visitors are allowed at one time in a patient room. The visitors may rotate out with other people during the day.  One visitor age 106 or older may stay with the patient overnight and must be in the room by 8 p.m.     Preparing for Surgery with CHLORHEXIDINE GLUCONATE (CHG) Soap  Chlorhexidine Gluconate (CHG) Soap  o An antiseptic cleaner that kills germs and bonds with the skin to continue killing germs even after washing  o Used for showering the night before surgery and morning of surgery  Before surgery, you can play an important role by reducing the number of germs on your skin.  CHG (Chlorhexidine gluconate) soap is an antiseptic cleanser which kills germs and bonds with the skin to continue killing germs even after washing.  Please do not use if you have an allergy to CHG or antibacterial soaps. If your skin becomes reddened/irritated stop using the CHG.  1. Shower the NIGHT BEFORE SURGERY and the MORNING OF SURGERY with CHG soap.  2. If you choose to wash your hair, wash your hair first as usual with your normal shampoo.  3. After shampooing, rinse your hair and body thoroughly to remove the shampoo.  4. Use CHG as you would any other liquid soap. You can apply CHG directly to the skin and wash gently with a scrungie or a clean washcloth.  5. Apply the CHG soap to your body only from the neck down. Do not use on open wounds or open sores. Avoid contact with your eyes, ears, mouth, and genitals (private parts). Wash face and genitals (private  parts) with your normal soap.  6. Wash thoroughly, paying special attention to the area where your surgery will be performed.  7. Thoroughly rinse your body with warm water.  8. Do not shower/wash with your normal soap after using and rinsing off the CHG soap.  9. Pat yourself dry with a clean towel.  10. Wear clean pajamas to bed the night before surgery.  12. Place clean sheets on your bed the night of your first shower and do not sleep with pets.  13. Shower again with the CHG soap on the day of surgery prior to arriving at the hospital.  14. Do not apply any deodorants/lotions/powders.  15. Please wear clean clothes to the hospital.  Preoperative Educational Videos for Total Hip, Knee and Shoulder Replacements  To better prepare for surgery, please view our videos that explain the physical activity and discharge planning required to have the best surgical recovery at [facility].  [email or web address]  Questions? Call [phone]  or email [email or web address]

## 2022-08-11 ENCOUNTER — Encounter
Admission: RE | Disposition: A | Discharge status: Home / Self Care | Payer: Self-pay | Source: Ambulatory Visit | Attending: Surgery

## 2022-08-11 ENCOUNTER — Other Ambulatory Visit: Payer: Self-pay

## 2022-08-11 ENCOUNTER — Ambulatory Visit: Payer: Medicare PPO | Admitting: Urgent Care

## 2022-08-11 ENCOUNTER — Ambulatory Visit: Payer: Medicare PPO

## 2022-08-11 ENCOUNTER — Ambulatory Visit: Payer: Medicare PPO | Admitting: Anesthesiology

## 2022-08-11 ENCOUNTER — Encounter: Payer: Self-pay | Admitting: Surgery

## 2022-08-11 ENCOUNTER — Ambulatory Visit
Admission: RE | Admit: 2022-08-11 | Discharge: 2022-08-11 | Disposition: A | Discharge status: Home / Self Care | Payer: Medicare PPO | Source: Ambulatory Visit | Attending: Surgery | Admitting: Surgery

## 2022-08-11 DIAGNOSIS — M1990 Unspecified osteoarthritis, unspecified site: Secondary | ICD-10-CM | POA: Diagnosis not present

## 2022-08-11 DIAGNOSIS — E213 Hyperparathyroidism, unspecified: Secondary | ICD-10-CM | POA: Diagnosis not present

## 2022-08-11 DIAGNOSIS — M8580 Other specified disorders of bone density and structure, unspecified site: Secondary | ICD-10-CM | POA: Diagnosis not present

## 2022-08-11 DIAGNOSIS — E785 Hyperlipidemia, unspecified: Secondary | ICD-10-CM | POA: Insufficient documentation

## 2022-08-11 DIAGNOSIS — Z79899 Other long term (current) drug therapy: Secondary | ICD-10-CM | POA: Insufficient documentation

## 2022-08-11 DIAGNOSIS — Z01812 Encounter for preprocedural laboratory examination: Secondary | ICD-10-CM

## 2022-08-11 DIAGNOSIS — M1711 Unilateral primary osteoarthritis, right knee: Secondary | ICD-10-CM | POA: Insufficient documentation

## 2022-08-11 DIAGNOSIS — Z85820 Personal history of malignant melanoma of skin: Secondary | ICD-10-CM | POA: Diagnosis not present

## 2022-08-11 HISTORY — PX: TOTAL KNEE ARTHROPLASTY: SHX125

## 2022-08-11 LAB — ABO/RH: ABO/RH(D): O POS

## 2022-08-11 SURGERY — ARTHROPLASTY, KNEE, TOTAL
Anesthesia: Spinal | Site: Knee | Laterality: Right

## 2022-08-11 MED ORDER — ACETAMINOPHEN 10 MG/ML IV SOLN
INTRAVENOUS | Status: DC | PRN
  Administered 2022-08-11: 1000 mg via INTRAVENOUS

## 2022-08-11 MED ORDER — LACTATED RINGERS IV SOLN: INTRAVENOUS | Status: DC

## 2022-08-11 MED ORDER — DEXAMETHASONE SODIUM PHOSPHATE 10 MG/ML IJ SOLN
INTRAMUSCULAR | Status: DC | PRN
  Administered 2022-08-11: 5 mg via INTRAVENOUS

## 2022-08-11 MED ORDER — OXYCODONE HCL 5 MG/5ML PO SOLN: 5.0000 mg | Freq: Once | ORAL | Status: DC | PRN

## 2022-08-11 MED ORDER — BUPIVACAINE HCL (PF) 0.5 % IJ SOLN
INTRAMUSCULAR | Status: DC | PRN
  Administered 2022-08-11: 2.6 mL

## 2022-08-11 MED ORDER — OXYCODONE HCL 5 MG PO TABS: 5.0000 mg | ORAL_TABLET | ORAL | Status: DC | PRN

## 2022-08-11 MED ORDER — ACETAMINOPHEN 325 MG PO TABS: 325.0000 mg | ORAL_TABLET | Freq: Four times a day (QID) | ORAL | Status: DC | PRN

## 2022-08-11 MED ORDER — ONDANSETRON HCL 4 MG/2ML IJ SOLN
INTRAMUSCULAR | Status: DC | PRN
  Administered 2022-08-11: 4 mg via INTRAVENOUS

## 2022-08-11 MED ORDER — EPINEPHRINE PF 1 MG/ML IJ SOLN
INTRAMUSCULAR | Status: AC
  Filled 2022-08-11: qty 1

## 2022-08-11 MED ORDER — MIDAZOLAM HCL 5 MG/5ML IJ SOLN
INTRAMUSCULAR | Status: DC | PRN
  Administered 2022-08-11: 2 mg via INTRAVENOUS

## 2022-08-11 MED ORDER — SODIUM CHLORIDE 0.9 % BOLUS PEDS
250.0000 mL | Freq: Once | INTRAVENOUS | Status: AC
  Administered 2022-08-11: 250 mL via INTRAVENOUS

## 2022-08-11 MED ORDER — ONDANSETRON HCL 4 MG PO TABS: 4.0000 mg | ORAL_TABLET | Freq: Four times a day (QID) | ORAL | Status: DC | PRN

## 2022-08-11 MED ORDER — CEFAZOLIN SODIUM-DEXTROSE 2-4 GM/100ML-% IV SOLN
2.0000 g | INTRAVENOUS | Status: AC
  Administered 2022-08-11: 2 g via INTRAVENOUS

## 2022-08-11 MED ORDER — ACETAMINOPHEN 10 MG/ML IV SOLN
INTRAVENOUS | Status: AC
  Filled 2022-08-11: qty 100

## 2022-08-11 MED ORDER — TRIAMCINOLONE ACETONIDE 40 MG/ML IJ SUSP
INTRAMUSCULAR | Status: AC
  Filled 2022-08-11: qty 1

## 2022-08-11 MED ORDER — FAMOTIDINE 20 MG PO TABS
20.0000 mg | ORAL_TABLET | Freq: Once | ORAL | Status: AC
  Administered 2022-08-11: 20 mg via ORAL

## 2022-08-11 MED ORDER — BUPIVACAINE HCL (PF) 0.5 % IJ SOLN
INTRAMUSCULAR | Status: AC
  Filled 2022-08-11: qty 30

## 2022-08-11 MED ORDER — TRANEXAMIC ACID 1000 MG/10ML IV SOLN
INTRAVENOUS | Status: AC
  Filled 2022-08-11: qty 10

## 2022-08-11 MED ORDER — TRANEXAMIC ACID 1000 MG/10ML IV SOLN
INTRAVENOUS | Status: DC | PRN
  Administered 2022-08-11: 1000 mg via TOPICAL

## 2022-08-11 MED ORDER — MIDAZOLAM HCL 2 MG/2ML IJ SOLN
INTRAMUSCULAR | Status: AC
  Filled 2022-08-11: qty 2

## 2022-08-11 MED ORDER — LACTATED RINGERS IV SOLN: INTRAVENOUS | Status: DC | PRN

## 2022-08-11 MED ORDER — PROPOFOL 10 MG/ML IV BOLUS
INTRAVENOUS | Status: AC
  Filled 2022-08-11: qty 20

## 2022-08-11 MED ORDER — CHLORHEXIDINE GLUCONATE 0.12 % MT SOLN
15.0000 mL | Freq: Once | OROMUCOSAL | Status: AC
  Administered 2022-08-11: 15 mL via OROMUCOSAL

## 2022-08-11 MED ORDER — METOCLOPRAMIDE HCL 5 MG/ML IJ SOLN: 5.0000 mg | Freq: Three times a day (TID) | INTRAMUSCULAR | Status: DC | PRN

## 2022-08-11 MED ORDER — CEFAZOLIN SODIUM-DEXTROSE 2-4 GM/100ML-% IV SOLN
INTRAVENOUS | Status: AC
  Filled 2022-08-11: qty 100

## 2022-08-11 MED ORDER — PHENYLEPHRINE HCL-NACL 20-0.9 MG/250ML-% IV SOLN
INTRAVENOUS | Status: DC | PRN
  Administered 2022-08-11: 30 ug/min via INTRAVENOUS

## 2022-08-11 MED ORDER — BUPIVACAINE LIPOSOME 1.3 % IJ SUSP
INTRAMUSCULAR | Status: AC
  Filled 2022-08-11: qty 20

## 2022-08-11 MED ORDER — OXYCODONE HCL 5 MG PO TABS: 5.0000 mg | ORAL_TABLET | ORAL | 0 refills | Status: AC | PRN

## 2022-08-11 MED ORDER — 0.9 % SODIUM CHLORIDE (POUR BTL) OPTIME
TOPICAL | Status: DC | PRN
  Administered 2022-08-11: 500 mL

## 2022-08-11 MED ORDER — OXYCODONE HCL 5 MG PO TABS: 5.0000 mg | ORAL_TABLET | Freq: Once | ORAL | Status: DC | PRN

## 2022-08-11 MED ORDER — APIXABAN 2.5 MG PO TABS: 2.5000 mg | ORAL_TABLET | Freq: Two times a day (BID) | ORAL | 0 refills | Status: AC

## 2022-08-11 MED ORDER — TRIAMCINOLONE ACETONIDE 40 MG/ML IJ SUSP
INTRAMUSCULAR | Status: DC | PRN
  Administered 2022-08-11: 93 mL

## 2022-08-11 MED ORDER — SODIUM CHLORIDE 0.9 % IV SOLN: INTRAVENOUS | Status: DC

## 2022-08-11 MED ORDER — KETOROLAC TROMETHAMINE 15 MG/ML IJ SOLN
15.0000 mg | Freq: Once | INTRAMUSCULAR | Status: AC
  Administered 2022-08-11: 15 mg via INTRAVENOUS

## 2022-08-11 MED ORDER — SODIUM CHLORIDE 0.9 % IR SOLN
Status: DC | PRN
  Administered 2022-08-11: 3000 mL

## 2022-08-11 MED ORDER — CEFAZOLIN SODIUM-DEXTROSE 2-4 GM/100ML-% IV SOLN
2.0000 g | Freq: Four times a day (QID) | INTRAVENOUS | Status: DC
  Administered 2022-08-11: 2 g via INTRAVENOUS

## 2022-08-11 MED ORDER — PROPOFOL 500 MG/50ML IV EMUL
INTRAVENOUS | Status: DC | PRN
  Administered 2022-08-11: 100 ug/kg/min via INTRAVENOUS
  Administered 2022-08-11: 40 mg via INTRAVENOUS

## 2022-08-11 MED ORDER — METOCLOPRAMIDE HCL 10 MG PO TABS: 5.0000 mg | ORAL_TABLET | Freq: Three times a day (TID) | ORAL | Status: DC | PRN

## 2022-08-11 MED ORDER — ORAL CARE MOUTH RINSE: 15.0000 mL | Freq: Once | OROMUCOSAL | Status: AC

## 2022-08-11 MED ORDER — KETOROLAC TROMETHAMINE 15 MG/ML IJ SOLN
INTRAMUSCULAR | Status: AC
  Filled 2022-08-11: qty 1

## 2022-08-11 MED ORDER — FENTANYL CITRATE (PF) 100 MCG/2ML IJ SOLN: 25.0000 ug | INTRAMUSCULAR | Status: DC | PRN

## 2022-08-11 MED ORDER — PROPOFOL 1000 MG/100ML IV EMUL
INTRAVENOUS | Status: AC
  Filled 2022-08-11: qty 100

## 2022-08-11 MED ORDER — CHLORHEXIDINE GLUCONATE 0.12 % MT SOLN
OROMUCOSAL | Status: AC
  Filled 2022-08-11: qty 15

## 2022-08-11 MED ORDER — ONDANSETRON HCL 4 MG/2ML IJ SOLN: 4.0000 mg | Freq: Four times a day (QID) | INTRAMUSCULAR | Status: DC | PRN

## 2022-08-11 MED ORDER — PHENYLEPHRINE HCL-NACL 20-0.9 MG/250ML-% IV SOLN
INTRAVENOUS | Status: AC
  Filled 2022-08-11: qty 250

## 2022-08-11 MED ORDER — FAMOTIDINE 20 MG PO TABS
ORAL_TABLET | ORAL | Status: AC
  Filled 2022-08-11: qty 1

## 2022-08-11 MED ORDER — PHENYLEPHRINE HCL (PRESSORS) 10 MG/ML IV SOLN
INTRAVENOUS | Status: DC | PRN
  Administered 2022-08-11: 160 ug via INTRAVENOUS

## 2022-08-11 MED ORDER — SODIUM CHLORIDE FLUSH 0.9 % IV SOLN
INTRAVENOUS | Status: AC
  Filled 2022-08-11: qty 40

## 2022-08-11 SURGICAL SUPPLY — 64 items
APL PRP STRL LF DISP 70% ISPRP (MISCELLANEOUS) ×1
BIT DRILL QUICK REL 1/8 2PK SL (DRILL) IMPLANT
BLADE SAW SAG 25X90X1.19 (BLADE) ×1 IMPLANT
BLADE SURG SZ20 CARB STEEL (BLADE) ×1 IMPLANT
BNDG CMPR STD VLCR NS LF 5.8X6 (GAUZE/BANDAGES/DRESSINGS) ×1
BNDG ELASTIC 6X5.8 VLCR NS LF (GAUZE/BANDAGES/DRESSINGS) ×1 IMPLANT
BRNG TIB 67X10 ANT STAB MDLR (Insert) ×1 IMPLANT
CEMENT BONE R 1X40 (Cement) ×2 IMPLANT
CEMENT VACUUM MIXING SYSTEM (MISCELLANEOUS) ×1 IMPLANT
CHLORAPREP W/TINT 26 (MISCELLANEOUS) ×1 IMPLANT
COMP PAT 3 PEG SERIES A 31/6.2 (Miscellaneous) ×1 IMPLANT
COMPONENT PAT3 PEG SERS 31/6.2 (Miscellaneous) IMPLANT
COOLER POLAR GLACIER W/PUMP (MISCELLANEOUS) ×1 IMPLANT
COVER MAYO STAND REUSABLE (DRAPES) ×1 IMPLANT
CUFF TOURN SGL QUICK 24 (TOURNIQUET CUFF)
CUFF TOURN SGL QUICK 34 (TOURNIQUET CUFF)
CUFF TRNQT CYL 24X4X16.5-23 (TOURNIQUET CUFF) IMPLANT
CUFF TRNQT CYL 34X4.125X (TOURNIQUET CUFF) IMPLANT
DRAPE 3/4 80X56 (DRAPES) ×1 IMPLANT
DRAPE IMP U-DRAPE 54X76 (DRAPES) ×1 IMPLANT
DRAPE U-SHAPE 47X51 STRL (DRAPES) ×1 IMPLANT
DRILL QUICK RELEASE 1/8 INCH (DRILL) ×3
DRSG MEPILEX SACRM 8.7X9.8 (GAUZE/BANDAGES/DRESSINGS) IMPLANT
DRSG OPSITE POSTOP 4X10 (GAUZE/BANDAGES/DRESSINGS) ×1 IMPLANT
DRSG OPSITE POSTOP 4X8 (GAUZE/BANDAGES/DRESSINGS) ×1 IMPLANT
ELECT REM PT RETURN 9FT ADLT (ELECTROSURGICAL) ×1
ELECTRODE REM PT RTRN 9FT ADLT (ELECTROSURGICAL) ×1 IMPLANT
GAUZE XEROFORM 1X8 LF (GAUZE/BANDAGES/DRESSINGS) ×1 IMPLANT
GLOVE BIO SURGEON STRL SZ7.5 (GLOVE) ×4 IMPLANT
GLOVE BIO SURGEON STRL SZ8 (GLOVE) ×4 IMPLANT
GLOVE BIOGEL PI IND STRL 8 (GLOVE) ×1 IMPLANT
GLOVE INDICATOR 8.0 STRL GRN (GLOVE) ×1 IMPLANT
GOWN STRL REUS W/ TWL LRG LVL3 (GOWN DISPOSABLE) ×1 IMPLANT
GOWN STRL REUS W/ TWL XL LVL3 (GOWN DISPOSABLE) ×1 IMPLANT
GOWN STRL REUS W/TWL LRG LVL3 (GOWN DISPOSABLE) ×1
GOWN STRL REUS W/TWL XL LVL3 (GOWN DISPOSABLE) ×1
HANDLE YANKAUER SUCT OPEN TIP (MISCELLANEOUS) ×1 IMPLANT
HOOD PEEL AWAY T7 (MISCELLANEOUS) ×3 IMPLANT
INSERT TIB BEARING 67X10 (Insert) IMPLANT
IV NS IRRIG 3000ML ARTHROMATIC (IV SOLUTION) ×1 IMPLANT
KIT TURNOVER KIT A (KITS) ×1 IMPLANT
KNEE CR FEMORAL RT 65MM (Femur) IMPLANT
MANIFOLD NEPTUNE II (INSTRUMENTS) ×1 IMPLANT
NDL SPNL 20GX3.5 QUINCKE YW (NEEDLE) ×1 IMPLANT
NEEDLE SPNL 20GX3.5 QUINCKE YW (NEEDLE) ×1 IMPLANT
NS IRRIG 1000ML POUR BTL (IV SOLUTION) ×1 IMPLANT
PACK TOTAL KNEE (MISCELLANEOUS) ×1 IMPLANT
PAD WRAPON POLAR KNEE (MISCELLANEOUS) ×1 IMPLANT
PENCIL SMOKE EVACUATOR (MISCELLANEOUS) ×1 IMPLANT
PLATE INTERLOK 6700 (Plate) IMPLANT
PULSAVAC PLUS IRRIG FAN TIP (DISPOSABLE) ×1
STAPLER SKIN PROX 35W (STAPLE) ×1 IMPLANT
SUCTION FRAZIER HANDLE 10FR (MISCELLANEOUS) ×1
SUCTION TUBE FRAZIER 10FR DISP (MISCELLANEOUS) ×1 IMPLANT
SUT VIC AB 0 CT1 36 (SUTURE) ×3 IMPLANT
SUT VIC AB 2-0 CT1 27 (SUTURE) ×3
SUT VIC AB 2-0 CT1 TAPERPNT 27 (SUTURE) ×3 IMPLANT
SYR 10ML LL (SYRINGE) ×1 IMPLANT
SYR 20ML LL LF (SYRINGE) ×1 IMPLANT
SYR 30ML LL (SYRINGE) IMPLANT
TIP FAN IRRIG PULSAVAC PLUS (DISPOSABLE) ×1 IMPLANT
TRAP FLUID SMOKE EVACUATOR (MISCELLANEOUS) ×2 IMPLANT
WATER STERILE IRR 500ML POUR (IV SOLUTION) ×1 IMPLANT
WRAPON POLAR PAD KNEE (MISCELLANEOUS) ×1

## 2022-08-11 NOTE — H&P (Signed)
History of Present Illness:  Sheri Curtis is a 67 y.o. 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 The patient was last seen for the symptoms in July, 2023. She continues to note moderate pain in her knee which she rates at 4/10 on today's visit. She has been taking Tylenol and or ibuprofen as necessary with limited benefit. Her symptoms are aggravated by any prolonged standing or ambulation, as well as with ascending/descending stairs. She is unable to reciprocate stairs, and even has difficulty with increased pain in her knee if she is sitting for too long without keeping her leg elevated. She has difficulty driving for prolonged periods of time. Her symptoms also occasionally awaken her from sleep. She denies any reinjury to the knee, and denies any fevers or chills. She also denies any numbness or paresthesias down her leg to her foot. She is quite frustrated by her continued symptoms and functional limitations, and is ready to consider more aggressive treatment options.  Current Outpatient Medications: acetaminophen (TYLENOL) 500 MG tablet Take 1,000 mg by mouth as needed for Pain  cholecalciferol (VITAMIN D3) 2,000 unit capsule Take 2,000 Units by mouth once daily.  colestipoL (COLESTID) 1 gram tablet Take 1 tablet (1 g total) by mouth 2 (two) times daily Take before meals 180 tablet 3  dicyclomine (BENTYL) 10 mg capsule TAKE 1 CAPSULE BY MOUTH 4 TIMES DAILY, BEFORE MEALS AND NIGHTLY OR TAKE 2 CAPSULES TWICE DAILY (AM AND PM). 120 capsule 0  fexofenadine (ALLEGRA) 180 MG tablet Take 180 mg by mouth once daily.  glucosamine-chondroitin 500-400 mg capsule Take 1 tablet by mouth once daily  ibuprofen (MOTRIN) 200 MG tablet Take 600 mg by mouth  as needed for Pain   Allergies:  Sulfa (Sulfonamide Antibiotics) Hives  Sulfur (Not Sulfa) Unknown and Hives   Past Medical History:  Colon polyp  Hyperparathyroidism (CMS-HCC) with chronic hypercalcemia in the 10.4 - 10.6 mg/dl range. Last eval Dr Gabriel Carina, Endocrine, in 09/2015.  Melanoma (CMS-HCC)  Osteopenia (DEXA scan 09/2014)   Past Surgical History:  Lap LOA and RSO 09/02/2011  COLONOSCOPY 04/02/2018 (Hyperplastic colon polyp/Repeat 66yrs/TKT)  EGD 04/02/2018 (Gastritis/No Repeat/TKT)  Right knee arthroscopy with debridement, medial meniscus repair, partial medial and lateral meniscectomies, and abrasion chondroplasty of grade III chondromalacia of the medial femoral condyle, right knee Right 06/03/2021 (Dr.Adraine Biffle)  HYSTERECTOMY (1985, TAH)  melonoma removal from back - 2004  [place] DEFECT - 1991   Family History:  Thyroid disease Mother  High blood pressure (Hypertension) Father  Other Father (Brain tumor)   Social History:   Socioeconomic History:  Marital status: Married  Tobacco Use  Smoking status: Never  Smokeless tobacco: Never  Vaping Use  Vaping Use: Never used  Substance and Sexual Activity  Alcohol use: No  Alcohol/week: 0.0 standard drinks of alcohol  Drug use: No  Sexual activity: Defer  Birth control/protection: Surgical   Review of Systems:  A comprehensive 14 point ROS was performed, reviewed, and the pertinent orthopaedic findings are documented in the HPI.  Physical Exam: Vitals:  07/11/22 1316  BP: 138/88  Weight: 61.1 kg (134 lb 12.8 oz)  Height: 157.5 cm (5\' 2" )  PainSc: 4  PainLoc: Knee   General/Constitutional: The patient appears to be well-nourished, well-developed, and  in no acute distress. Neuro/Psych: Normal mood and affect, oriented to person, place and time. Eyes: Non-icteric. Pupils are equal, round, and reactive to light, and exhibit synchronous movement. ENT: Unremarkable. Lymphatic:  No palpable adenopathy. Respiratory: Lungs clear to auscultation, Normal chest excursion, No wheezes, and Non-labored breathing Cardiovascular: Regular rate and rhythm. No murmurs. and No edema, swelling or tenderness, except as noted in detailed exam. Integumentary: No impressive skin lesions present, except as noted in detailed exam. Musculoskeletal: Unremarkable, except as noted in detailed exam.  Right knee exam: The patient demonstrates a minimal limp, but does not use any assistive devices. On inspection, her arthroscopic portal sites remain well-healed and without evidence for infection. There is perhaps minimal residual swelling around the knee, but no erythema, ecchymosis, abrasions, or other skin abnormalities are identified. She exhibits a trace effusion. She describes mild tenderness to palpation along the medial joint line and minimal tenderness along the lateral joint line. Actively, she can extend her knee to near 0 degrees and flex her knee beyond 125 degrees. Passively, the knee can be extended to 0 degrees and flexed beyond 130 degrees. Her patella tracks well, but exhibits mild crepitance. The knee is stable to varus and valgus stressing. She is neurovascular intact to the right lower extremity and foot.  X-rays/MRI/Lab data:  Standing AP and lateral x-rays of the right knee, as well as a sunrise view, are obtained. These films demonstrate moderate degenerative changes as manifest by medial greater than lateral joint space narrowing and early osteophyte formation. Lesser degenerative changes of the patellofemoral compartment also are noted. No lytic lesions, abnormal calcifications, or other acute bony abnormalities are identified.  Assessment: Primary osteoarthritis of right knee.   Plan: The treatment options were discussed with the patient. In addition, patient educational materials were provided regarding the diagnosis and treatment options. The patient is quite frustrated by  her continued symptoms and functional limitations, and is ready to consider more aggressive treatment options. Therefore, I have recommended a surgical procedure, specifically a right total knee arthroplasty. The procedure was discussed with the patient, as were the potential risks (including bleeding, infection, nerve and/or blood vessel injury, persistent or recurrent pain, loosening and/or failure of the components, dislocation, need for further surgery, blood clots, strokes, heart attacks and/or arhythmias, pneumonia, etc.) and benefits. The patient states his/her understanding and wishes to proceed. All of the patient's questions and concerns were answered. She can call any time with further concerns. She will follow up post-surgery, routine.    H&P reviewed and patient re-examined. No changes.

## 2022-08-11 NOTE — Transfer of Care (Signed)
Immediate Anesthesia Transfer of Care Note  Patient: Sheri Curtis  Procedure(s) Performed: TOTAL KNEE ARTHROPLASTY - RNFA (Right: Knee)  Patient Location: PACU  Anesthesia Type:MAC and Spinal  Level of Consciousness: drowsy  Airway & Oxygen Therapy: Patient Spontanous Breathing and Patient connected to face mask oxygen  Post-op Assessment: Report given to RN and Post -op Vital signs reviewed and stable  Post vital signs: Reviewed  Last Vitals:  Vitals Value Taken Time  BP 119/56 08/11/22 1007  Temp 31F   Pulse 65 08/11/22 1010  Resp 17 08/11/22 1010  SpO2 100 % 08/11/22 1010  Vitals shown include unvalidated device data.  Last Pain:  Vitals:   08/11/22 0627  TempSrc: Temporal  PainSc: 0-No pain         Complications: No notable events documented.

## 2022-08-11 NOTE — TOC Initial Note (Signed)
Transition of Care [facility]) - Initial/Assessment Note    Patient Details  Name: Sheri Curtis MRN: [id number] Date of Birth: [birth year]  Transition of Care [facility]) CM/SW Contact:    Beverly Sessions, RN Phone Number: 08/11/2022, 2:56 PM  Clinical Narrative:                  Patient set up with [facility] preop [place] with [place] notified of discharge Referral for [facility] made to [facility] with adapt        Patient Goals and CMS Choice            Expected Discharge Plan and Services         Expected Discharge Date: 08/11/22                                    Prior Living Arrangements/Services                       Activities of Daily Living      Permission Sought/Granted                  Emotional Assessment              Admission diagnosis:  Primary osteoarthritis of right knee M17.11 There are no problems to display for this patient.  PCP:  Baxter Hire, MD Pharmacy:   [facility] [address] ([address]), [place]) [place] [address] Phone: [phone] Fax: [phone]     Social Determinants of Health (SDOH) Social History: SDOH Screenings   Tobacco Use: Low Risk  (08/11/2022)   SDOH Interventions:     Readmission Risk Interventions     No data to display

## 2022-08-11 NOTE — Evaluation (Signed)
Physical Therapy Evaluation Patient Details Name: Sheri Curtis MRN: [id number] DOB: 12-10-1955 Today's Date: 08/11/2022  History of Present Illness  Pt is a 67 yo female s/p R TKA. PMH of HLD, IBS, melanoma.  Clinical Impression  Patient alert agreeable to PT, denied pain in RLE. Pt was independent at baseline, will have family available to assist as needed.   The patient was administered her HEP as well as polar care instructions (demonstrated and pt family able to teach back). Sit <> stand with RW and supervision, CGA from standard commode due to small LOB. She ambulated ~129ft with RW and CGA, able to progress to step through with verbal cueing and improved weight bearing noted. Pt still with some unresolved block (evidenced by decreased dorsiflexion) and RN aware.  Overall the patient demonstrated deficits (see "PT Problem List") that impede the patient's functional abilities, safety, and mobility and would benefit from skilled PT intervention. Recommendation at this time is to continued skilled PT intervention to return pt to PLOF.        Recommendations for follow up therapy are one component of a multi-disciplinary discharge planning process, led by the attending physician.  Recommendations may be updated based on patient status, additional functional criteria and insurance authorization.  Follow Up Recommendations       Assistance Recommended at Discharge Intermittent Supervision/Assistance  Patient can return home with the following  A little help with walking and/or transfers;Assistance with cooking/housework;Assist for transportation;Help with stairs or ramp for entrance;A little help with bathing/dressing/bathroom    Equipment Recommendations BSC/3in1  Recommendations for Other Services       Functional Status Assessment Patient has had a recent decline in their functional status and demonstrates the ability to make significant improvements in function in a reasonable and  predictable amount of time.     Precautions / Restrictions Precautions Precautions: Fall;Knee Precaution Booklet Issued: Yes (comment) Restrictions Weight Bearing Restrictions: Yes RLE Weight Bearing: Weight bearing as tolerated      Mobility  Bed Mobility               General bed mobility comments: pt up in chair at start of session    Transfers Overall transfer level: Needs assistance Equipment used: Rolling walker (2 wheels) Transfers: Sit to/from Stand Sit to Stand: Supervision, Min guard           General transfer comment: supervision from recliner, CGA from standard commode    Ambulation/Gait Ambulation/Gait assistance: Min guard Gait Distance (Feet): 100 Feet Assistive device: Rolling walker (2 wheels) Gait Pattern/deviations: Step-to pattern, Step-through pattern, Decreased stance time - right, Decreased dorsiflexion - right       General Gait Details: able to progress to step through with cues  Stairs            Wheelchair Mobility    Modified Rankin (Stroke Patients Only)       Balance Overall balance assessment: Needs assistance Sitting-balance support: Feet supported Sitting balance-Leahy Scale: Good     Standing balance support: Reliant on assistive device for balance Standing balance-Leahy Scale: Fair                               Pertinent Vitals/Pain Pain Assessment Pain Assessment: No/denies pain    Home Living Family/patient expects to be discharged to:: Private residence Living Arrangements: Spouse/significant other;Parent Available Help at Discharge: Family Type of Home: House Home Access: Ramped entrance;Stairs to enter Entrance Stairs-Rails:  None Entrance Stairs-Number of Steps: 2   Home Layout: One level Home Equipment: Conservation officer, nature (2 wheels)      Prior Function Prior Level of Function : Independent/Modified Independent                     Hand Dominance   Dominant Hand:  Right    Extremity/Trunk Assessment   Upper Extremity Assessment Upper Extremity Assessment: Overall WFL for tasks assessed    Lower Extremity Assessment Lower Extremity Assessment: Overall WFL for tasks assessed (s/p R TKA)    Cervical / Trunk Assessment Cervical / Trunk Assessment: Normal  Communication   Communication: No difficulties  Cognition Arousal/Alertness: Awake/alert Behavior During Therapy: WFL for tasks assessed/performed Overall Cognitive Status: Within Functional Limits for tasks assessed                                          General Comments      Exercises     Assessment/Plan    PT Assessment Patient needs continued PT services  PT Problem List Decreased strength;Decreased mobility;Decreased range of motion;Decreased knowledge of precautions;Decreased activity tolerance;Decreased balance;Pain;Decreased knowledge of use of DME       PT Treatment Interventions DME instruction;Therapeutic activities;Gait training;Therapeutic exercise;Stair training;Balance training;Patient/family education;Functional mobility training;Neuromuscular re-education    PT Goals (Current goals can be found in the Care Plan section)  Acute Rehab PT Goals Patient Stated Goal: to go home PT Goal Formulation: With patient Time For Goal Achievement: 08/25/22 Potential to Achieve Goals: Good    Frequency BID     Co-evaluation               AM-PAC PT "6 Clicks" Mobility  Outcome Measure Help needed turning from your back to your side while in a flat bed without using bedrails?: None Help needed moving from lying on your back to sitting on the side of a flat bed without using bedrails?: None Help needed moving to and from a bed to a chair (including a wheelchair)?: None Help needed standing up from a chair using your arms (e.g., wheelchair or bedside chair)?: None Help needed to walk in hospital room?: A Little Help needed climbing 3-5 steps with a  railing? : A Little 6 Click Score: 22    End of Session Equipment Utilized During Treatment: Gait belt Activity Tolerance: Patient tolerated treatment well Patient left: in bed;with family/visitor present Nurse Communication: Mobility status PT Visit Diagnosis: Other abnormalities of gait and mobility (R26.89);Muscle weakness (generalized) (M62.81);Difficulty in walking, not elsewhere classified (R26.2);Pain Pain - Right/Left: Right Pain - part of body: Knee    Time: [id number] PT Time Calculation (min) (ACUTE ONLY): 36 min   Charges:   PT Evaluation $PT Eval Low Complexity: 1 Low PT Treatments $Gait Training: 8-22 mins $Therapeutic Activity: 8-22 mins      Lieutenant Diego PT, DPT 2:52 PM,08/11/22

## 2022-08-11 NOTE — Op Note (Signed)
08/11/2022  10:20 AM  Patient:   Sheri Curtis  Pre-Op Diagnosis:   Degenerative joint disease, right knee.  Post-Op Diagnosis:   Same  Procedure:   Right TKA using all-cemented [employer] Vanguard system with a 65 mm PCR femur, a 67 mm tibial tray with a 10 mm anterior stabilized E-poly insert, and a 31 x 6.2 mm all-poly 3-pegged domed patella.  Surgeon:   Pascal Lux, MD  Assistant:   Waylan Boga, RNFA; Larrie Kass, PA-S  Anesthesia:   Spinal  Findings:   As above  Complications:   None  EBL:   5 cc  Fluids:   700 cc crystalloid  UOP:   None  TT:   90 minutes at 300 mmHg  Drains:   None  Closure:   Staples  Implants:   As above  Brief Clinical Note:   The patient is a 67 year old female with a history of progressively worsening right knee pain. The patient's symptoms have progressed despite medications, activity modification, injections, etc. The patient's history and examination were consistent with advanced degenerative joint disease of the right knee confirmed by plain radiographs. The patient presents at this time for a right total knee arthroplasty.  Procedure:   The patient was brought into the operating room. After adequate spinal anesthesia was obtained, the patient was lain in the supine position before the right lower extremity was prepped with ChloraPrep solution and draped sterilely. Preoperative antibiotics were administered. A timeout was performed to verify the appropriate surgical site before the limb was exsanguinated with an Esmarch and the tourniquet inflated to 300 mmHg.   A standard anterior approach to the knee was made through an approximately 7 inch incision. The incision was carried down through the subcutaneous tissues to expose superficial retinaculum. This was split the length of the incision and the medial flap elevated sufficiently to expose the medial retinaculum. The medial retinaculum was incised, leaving a 3-4 mm cuff of tissue on the  patella. This was extended distally along the medial border of the patellar tendon and proximally through the medial third of the quadriceps tendon. A subtotal fat pad excision was performed before the soft tissues were elevated off the anteromedial and anterolateral aspects of the proximal tibia to the level of the collateral ligaments. The anterior portions of the medial and lateral menisci were removed, as was the anterior cruciate ligament. With the knee flexed to 90, the external tibial guide was positioned and the appropriate proximal tibial cut made. This piece was taken to the back table where it was measured and found to be optimally replicated by a 67 mm component.  Attention was directed to the distal femur. The intramedullary canal was accessed through a 3/8" drill hole. The intramedullary guide was inserted and positioned in order to obtain a neutral flexion gap. The intercondylar block was positioned with care taken to avoid notching the anterior cortex of the femur. The appropriate cut was made. Next, the distal cutting block was placed at 5 of valgus alignment. Using the 9 mm slot, the distal cut was made. The distal femur was measured and found to be optimally replicated by the 65 mm component. The 65 mm 4-in-1 cutting block was positioned and first the posterior, then the posterior chamfer, the anterior chamfer, and finally the anterior cuts were made. At this point, the posterior portions medial and lateral menisci were removed. A trial reduction was performed using the appropriate femoral and tibial components with the 10 mm insert.  This demonstrated excellent stability to varus and valgus stressing both in flexion and extension while permitting full extension. Patella tracking was assessed and found to be excellent. Therefore, the tibial guide position was marked on the proximal tibia. The patella thickness was measured and found to be 20 mm. Therefore, the appropriate cut was made. The  patellar surface was measured and found to be optimally replicated by the 31 mm component. The three peg holes were drilled in place before the trial button was inserted. Patella tracking was assessed and found to be excellent, passing the "no thumb test". The lug holes were drilled into the distal femur before the trial component was removed, leaving only the tibial tray. The keel was then created using the appropriate tower, reamer, and punch.  The bony surfaces were prepared for cementing by irrigating them thoroughly with sterile saline solution via the jet lavage system. A bone plug was fashioned from some of the bone that had been removed previously and used to plug the distal femoral canal. In addition, a "cocktail" of 20 cc of Exparel, 30 cc of 0.5% Sensorcaine, 2 cc of Kenalog 40 (80 mg), and 30 mg of Toradol diluted out to 90 cc with normal saline was injected into the postero-medial and postero-lateral aspects of the knee, the medial and lateral gutter regions, and the peri-incisional tissues to help with postoperative analgesia. Meanwhile, the cement was being mixed on the back table. When it was ready, the tibial tray was cemented in first. The excess cement was removed using [employer] fast streamer. Next, the femoral component was impacted into place. Again, the excess cement was removed using [employer] fast streamer. The 10 mm trial insert was positioned and the knee brought into extension while the cement hardened. Finally, the patella was cemented into place and secured using the patellar clamp. Again, the excess cement was removed using [employer] fast streamer. Once the cement had hardened, the knee was placed through a range of motion with the findings as described above. Therefore, the trial insert was removed and, after verifying that no cement had been retained posteriorly, the permanent 10 mm anterior stabilized E-polyethylene insert was positioned and secured using the appropriate key locking mechanism. Again the  knee was placed through a range of motion with the findings as described above.  The wound was copiously irrigated with sterile saline solution using the jet lavage system before the quadriceps tendon and retinacular layer were reapproximated using #0 Vicryl interrupted sutures. The superficial retinacular layer also was closed using a running #0 Vicryl suture. A total of 10 cc of transexemic acid (TXA) was injected intra-articularly before the subcutaneous tissues were closed in several layers using 2-0 Vicryl interrupted sutures. The skin was closed using staples. A sterile honeycomb dressing was applied to the skin before the leg was wrapped with an Ace wrap to accommodate the Polar Care device. The patient was then awakened and returned to the recovery room in satisfactory condition after tolerating the procedure well.

## 2022-08-11 NOTE — Progress Notes (Signed)
Patient is not able to walk the distance required to go the bathroom, or he/she is unable to safely negotiate stairs required to access the bathroom.  A 3in1 BSC will alleviate this problem  

## 2022-08-11 NOTE — Anesthesia Preprocedure Evaluation (Signed)
Anesthesia Evaluation  Patient identified by MRN, date of birth, ID band Patient awake    Reviewed: Allergy & Precautions, NPO status , Patient's Chart, lab work & pertinent test results  History of Anesthesia Complications Negative for: history of anesthetic complications  Airway Mallampati: II  TM Distance: >3 FB Neck ROM: full    Dental no notable dental hx.    Pulmonary neg pulmonary ROS   Pulmonary exam normal        Cardiovascular negative cardio ROS Normal cardiovascular exam  Hx of asd repair, denies symptoms    Neuro/Psych negative neurological ROS  negative psych ROS   GI/Hepatic negative GI ROS, Neg liver ROS,,,  Endo/Other  negative endocrine ROS  hyperparathyroid  Renal/GU negative Renal ROS  negative genitourinary   Musculoskeletal  (+) Arthritis ,    Abdominal   Peds  Hematology negative hematology ROS (+)   Anesthesia Other Findings Past Medical History: No date: Arthritis No date: Headache(784.0) No date: Heart disease No date: Hyperlipidemia No date: Hyperparathyroidism No date: IBS (irritable bowel syndrome) 05/09/2002: Melanoma     Comment:  BACK No date: Osteopenia No date: Sinus problem No date: Wears glasses  Past Surgical History: 1985: ABDOMINAL HYSTERECTOMY No date: APPENDECTOMY 1991: ASD REPAIR     Comment:  HEART REPAIR 04/02/2018: COLONOSCOPY WITH ESOPHAGOGASTRODUODENOSCOPY (EGD) 01/28/2021: IR FLUORO GUIDED NEEDLE PLC ASPIRATION/INJECTION LOC 06/03/2021: KNEE ARTHROSCOPY; Right     Comment:  Procedure: RIGHT KNEE ARTHROSCOPY WITH DEBRIDEMENT AND               PARTAIL MEDIAL AND PARTIAL LATERAL MENISCECTOMIES, MEDIAL              MENISCUS REPAIR, ABRASION CHONDROPLASTY OF MEDIAL FEMORAL              CONDYLE;  Surgeon: Corky Mull, MD;  Location: [facility]               ORS;  Service: Orthopedics;  Laterality: Right; 09/02/2011: OOPHORECTOMY; Right 04/2019: pelvic  reconstruction surgery     Reproductive/Obstetrics negative OB ROS                             Anesthesia Physical Anesthesia Plan  ASA: 2  Anesthesia Plan: Spinal   Post-op Pain Management: Regional block*   Induction: Intravenous  PONV Risk Score and Plan: 2 and Propofol infusion and TIVA  Airway Management Planned: Natural Airway and Nasal Cannula  Additional Equipment:   Intra-op Plan:   Post-operative Plan:   Informed Consent: I have reviewed the patients History and Physical, chart, labs and discussed the procedure including the risks, benefits and alternatives for the proposed anesthesia with the patient or authorized representative who has indicated his/her understanding and acceptance.     Dental Advisory Given  Plan Discussed with: Anesthesiologist, CRNA and Surgeon  Anesthesia Plan Comments: (Patient reports no bleeding problems and no anticoagulant use.  Plan for spinal with backup GA  Patient consented for risks of anesthesia including but not limited to:  - adverse reactions to medications - damage to eyes, teeth, lips or other oral mucosa - nerve damage due to positioning  - risk of bleeding, infection and or nerve damage from spinal that could lead to paralysis - risk of headache or failed spinal - damage to teeth, lips or other oral mucosa - sore throat or hoarseness - damage to heart, brain, nerves, lungs, other parts of body or loss of life  Patient voiced understanding.)       Anesthesia Quick Evaluation

## 2022-08-11 NOTE — Anesthesia Procedure Notes (Signed)
Spinal  Patient location during procedure: OR Start time: 08/11/2022 7:40 AM End time: 08/11/2022 7:52 AM Reason for block: post-op pain management Staffing Performed: resident/CRNA  Anesthesiologist: Ilene Qua, MD Resident/CRNA: Otho Perl, CRNA Performed by: Otho Perl, CRNA Authorized by: Ilene Qua, MD   Preanesthetic Checklist Completed: patient identified, IV checked, site marked, risks and benefits discussed, surgical consent, monitors and equipment checked, pre-op evaluation and timeout performed Spinal Block Patient position: sitting Prep: Betadine, ChloraPrep and site prepped and draped Patient monitoring: heart rate, continuous pulse ox, blood pressure and cardiac monitor Approach: midline Location: L2-3 Injection technique: single-shot Needle Needle type: Whitacre and Introducer  Needle gauge: 24 G Needle length: 9 cm Assessment Events: CSF return Additional Notes Negative paresthesia. Negative blood return. Positive free-flowing CSF. Expiration date of kit checked and confirmed. Patient tolerated procedure well, without complications.

## 2022-08-11 NOTE — Discharge Instructions (Addendum)
Orthopedic discharge instructions: May shower with intact OpSite dressing. Apply ice frequently to knee or use Polar Care. Start Eliquis 1 tablet (2.5 mg) twice daily on Friday, 08/12/2022, for 2 weeks, then take aspirin 325 mg twice daily for 4 weeks. Take pain medication as prescribed when needed.  May supplement with ES Tylenol if necessary. May weight-bear as tolerated on right leg - use walker for balance and support. Follow-up in 10-14 days or as scheduled.       POLAR CARE INFORMATION  [email or web address]  How to use [place] Cold Therapy System?  YouTube   [email or web address]  OPERATING INSTRUCTIONS  Start the product With dry hands, connect the transformer to the electrical connection located on the top of the cooler. Next, plug the transformer into an appropriate electrical outlet. The unit will automatically start running at this point.  To stop the pump, disconnect electrical power.  Unplug to stop the product when not in use. Unplugging the Polar Care unit turns it off. Always unplug immediately after use. Never leave it plugged in while unattended. Remove pad.    FIRST ADD WATER TO FILL LINE, THEN ICE---Replace ice when existing ice is almost melted  1 Discuss Treatment with your [facility] Practitioner and Use Only as Prescribed 2 Apply Insulation Barrier & Cold Therapy Pad 3 Check for Moisture 4 Inspect Skin Regularly  Tips and Trouble Shooting Usage Tips 1. Use cubed or chunked ice for optimal performance. 2. It is recommended to drain the Pad between uses. To drain the pad, hold the Pad upright with the hose pointed toward the ground. Depress the black plunger and allow water to drain out. 3. You may disconnect the Pad from the unit without removing the pad from the affected area by depressing the silver tabs on the hose coupling and gently pulling the hoses apart. The Pad and unit will seal itself and will not leak.  Note: Some dripping during release is normal. 4. DO NOT RUN PUMP WITHOUT WATER! The pump in this unit is designed to run with water. Running the unit without water will cause permanent damage to the pump. 5. Unplug unit before removing lid.  TROUBLESHOOTING GUIDE Pump not running, Water not flowing to the pad, Pad is not getting cold 1. Make sure the transformer is plugged into the wall outlet. 2. Confirm that the ice and water are filled to the indicated levels. 3. Make sure there are no kinks in the pad. 4. Gently pull on the blue tube to make sure the tube/pad junction is straight. 5. Remove the pad from the treatment site and ll it while the pad is lying at; then reapply. 6. Confirm that the pad couplings are securely attached to the unit. Listen for the double clicks (Figure 1) to confirm the pad couplings are securely attached.  Leaks    Note: Some condensation on the lines, controller, and pads is unavoidable, especially in warmer climates. 1. If using a [place] Polar Care Cold Therapy unit with a detachable Cold Therapy Pad, and a leak exists (other than condensation on the lines) disconnect the pad couplings. Make sure the silver tabs on the couplings are depressed before reconnecting the pad to the pump hose; then confirm both sides of the coupling are properly clicked in. 2. If the coupling continues to leak or a leak is detected in the pad itself, stop using it and call [facility] at [phone].  Cleaning After use, empty and dry the  unit with a soft cloth. Warm water and mild detergent may be used occasionally to clean the pump and tubes.  WARNING: The [place] can be cold enough to cause serious injury, including full skin necrosis. Follow these Operating Instructions, and carefully read the Product Insert (see pouch on side of unit) and the Cold Therapy Pad Fitting Instructions (provided with each Cold Therapy Pad) prior to use.       AMBULATORY SURGERY   DISCHARGE INSTRUCTIONS   The drugs that you were given will stay in your system until tomorrow so for the next 24 hours you should not:  Drive an automobile Make any legal decisions Drink any alcoholic beverage   You may resume regular meals tomorrow.  Today it is better to start with liquids and gradually work up to solid foods.  You may eat anything you prefer, but it is better to start with liquids, then soup and crackers, and gradually work up to solid foods.   Please notify your doctor immediately if you have any unusual bleeding, trouble breathing, redness and pain at the surgery site, drainage, fever, or pain not relieved by medication.    Information for Discharge Teaching:  DO NOT REMOVE TEAL EXPAREL BRACELET FOR 4 DAYS (96 hours) 08/15/2022 EXPAREL (bupivacaine liposome injectable suspension)   Your surgeon or anesthesiologist gave you EXPAREL(bupivacaine) to help control your pain after surgery.  EXPAREL is a local anesthetic that provides pain relief by numbing the tissue around the surgical site. EXPAREL is designed to release pain medication over time and can control pain for up to 72 hours. Depending on how you respond to EXPAREL, you may require less pain medication during your recovery.  Possible side effects: Temporary loss of sensation or ability to move in the area where bupivacaine was injected. Nausea, vomiting, constipation Rarely, numbness and tingling in your mouth or lips, lightheadedness, or anxiety may occur. Call your doctor right away if you think you may be experiencing any of these sensations, or if you have other questions regarding possible side effects.  Follow all other discharge instructions given to you by your surgeon or nurse. Eat a healthy diet and drink plenty of water or other fluids.  If you return to the hospital for any reason within 96 hours following the administration of EXPAREL, it is important for health care providers to know  that you have received this anesthetic. A teal colored band has been placed on your arm with the date, time and amount of EXPAREL you have received in order to alert and inform your health care providers. Please leave this armband in place for the full 96 hours following administration, and then you may remove the band.

## 2022-08-12 ENCOUNTER — Encounter: Payer: Self-pay | Admitting: Surgery

## 2022-08-15 NOTE — Anesthesia Postprocedure Evaluation (Signed)
Anesthesia Post Note  Patient: JEANNETTE DEFAZIO  Procedure(s) Performed: TOTAL KNEE ARTHROPLASTY - RNFA (Right: Knee)  Patient location during evaluation: PACU Anesthesia Type: Spinal Level of consciousness: oriented and awake and alert Pain management: pain level controlled Vital Signs Assessment: post-procedure vital signs reviewed and stable Respiratory status: spontaneous breathing, respiratory function stable and patient connected to nasal cannula oxygen Cardiovascular status: blood pressure returned to baseline and stable Postop Assessment: no headache, no backache, no apparent nausea or vomiting and spinal receding Anesthetic complications: no Comments: Patient d/c prior to complete recession of spinal, patient able to ambulate but still has some weakness    No notable events documented.   Last Vitals:  Vitals:   08/11/22 1220 08/11/22 1445  BP: (!) 146/62 (!) 140/64  Pulse: 62 64  Resp: 16 16  Temp: 36.4 C 36.6 C  SpO2: 100% 100%    Last Pain:  Vitals:   08/11/22 1445  TempSrc: Temporal  PainSc:                  Louie Boston

## 2023-05-30 ENCOUNTER — Other Ambulatory Visit: Payer: Self-pay | Admitting: Internal Medicine

## 2023-05-30 DIAGNOSIS — Z1231 Encounter for screening mammogram for malignant neoplasm of breast: Secondary | ICD-10-CM

## 2023-06-20 ENCOUNTER — Ambulatory Visit
Admission: RE | Admit: 2023-06-20 | Discharge: 2023-06-20 | Disposition: A | Discharge status: Home / Self Care | Payer: Medicare PPO | Source: Ambulatory Visit | Attending: Internal Medicine | Admitting: Internal Medicine

## 2023-06-20 DIAGNOSIS — Z1231 Encounter for screening mammogram for malignant neoplasm of breast: Secondary | ICD-10-CM | POA: Insufficient documentation

## 2024-05-22 ENCOUNTER — Other Ambulatory Visit: Payer: Self-pay | Admitting: Internal Medicine

## 2024-05-22 DIAGNOSIS — Z1231 Encounter for screening mammogram for malignant neoplasm of breast: Secondary | ICD-10-CM

## 2024-06-20 ENCOUNTER — Encounter
